# Patient Record
Sex: Male | Born: 1955 | Race: White | Hispanic: No | Smoking: Current some day smoker
Health system: Southern US, Community
[De-identification: ages and names within clinical notes are randomized; demographics above are authoritative.]

## PROBLEM LIST (undated history)

## (undated) DIAGNOSIS — J449 Chronic obstructive pulmonary disease, unspecified: Secondary | ICD-10-CM

## (undated) DIAGNOSIS — I10 Essential (primary) hypertension: Secondary | ICD-10-CM

## (undated) DIAGNOSIS — I219 Acute myocardial infarction, unspecified: Secondary | ICD-10-CM

## (undated) DIAGNOSIS — E119 Type 2 diabetes mellitus without complications: Secondary | ICD-10-CM

## (undated) DIAGNOSIS — E7800 Pure hypercholesterolemia, unspecified: Secondary | ICD-10-CM

## (undated) HISTORY — DX: Acute myocardial infarction, unspecified: I21.9

## (undated) HISTORY — PX: NO PAST SURGERIES: SHX2092

---

## 2007-05-16 ENCOUNTER — Emergency Department (HOSPITAL_COMMUNITY): Admission: EM | Admit: 2007-05-16 | Discharge: 2007-05-16 | Payer: Self-pay | Admitting: Emergency Medicine

## 2014-06-05 ENCOUNTER — Emergency Department: Payer: Self-pay | Admitting: Emergency Medicine

## 2014-06-05 LAB — CBC
HCT: 42.5 % (ref 40.0–52.0)
HGB: 14.1 g/dL (ref 13.0–18.0)
MCH: 30.2 pg (ref 26.0–34.0)
MCHC: 33.1 g/dL (ref 32.0–36.0)
MCV: 91 fL (ref 80–100)
PLATELETS: 198 10*3/uL (ref 150–440)
RBC: 4.67 10*6/uL (ref 4.40–5.90)
RDW: 13.1 % (ref 11.5–14.5)
WBC: 8.1 10*3/uL (ref 3.8–10.6)

## 2014-06-05 LAB — BASIC METABOLIC PANEL
Anion Gap: 10 (ref 7–16)
BUN: 14 mg/dL (ref 7–18)
CHLORIDE: 100 mmol/L (ref 98–107)
CO2: 26 mmol/L (ref 21–32)
CREATININE: 1.06 mg/dL (ref 0.60–1.30)
Calcium, Total: 9.1 mg/dL (ref 8.5–10.1)
EGFR (African American): >60
GLUCOSE: 407 mg/dL — AB (ref 65–99)
Osmolality: 290 (ref 275–301)
Potassium: 3.6 mmol/L (ref 3.5–5.1)
SODIUM: 136 mmol/L (ref 136–145)

## 2014-06-05 LAB — TROPONIN I: Troponin-I: <0.02 ng/mL

## 2014-06-06 LAB — TROPONIN I

## 2014-06-06 LAB — PRO B NATRIURETIC PEPTIDE: B-TYPE NATIURETIC PEPTID: 101 pg/mL (ref 0–125)

## 2014-06-06 LAB — D-DIMER(ARMC): D-DIMER: 187 ng/mL

## 2014-11-19 ENCOUNTER — Emergency Department: Payer: Medicaid Other

## 2014-11-19 ENCOUNTER — Encounter: Payer: Self-pay | Admitting: Emergency Medicine

## 2014-11-19 ENCOUNTER — Emergency Department
Admission: EM | Admit: 2014-11-19 | Discharge: 2014-11-19 | Discharge status: Against Medical Advice | Payer: Medicaid Other | Attending: Emergency Medicine | Admitting: Emergency Medicine

## 2014-11-19 DIAGNOSIS — J441 Chronic obstructive pulmonary disease with (acute) exacerbation: Secondary | ICD-10-CM | POA: Diagnosis not present

## 2014-11-19 DIAGNOSIS — R079 Chest pain, unspecified: Secondary | ICD-10-CM

## 2014-11-19 DIAGNOSIS — Z720 Tobacco use: Secondary | ICD-10-CM | POA: Insufficient documentation

## 2014-11-19 DIAGNOSIS — E119 Type 2 diabetes mellitus without complications: Secondary | ICD-10-CM | POA: Insufficient documentation

## 2014-11-19 DIAGNOSIS — I10 Essential (primary) hypertension: Secondary | ICD-10-CM | POA: Insufficient documentation

## 2014-11-19 HISTORY — DX: Chronic obstructive pulmonary disease, unspecified: J44.9

## 2014-11-19 HISTORY — DX: Essential (primary) hypertension: I10

## 2014-11-19 HISTORY — DX: Type 2 diabetes mellitus without complications: E11.9

## 2014-11-19 HISTORY — DX: Pure hypercholesterolemia, unspecified: E78.00

## 2014-11-19 LAB — BASIC METABOLIC PANEL
ANION GAP: 8 (ref 5–15)
BUN: 10 mg/dL (ref 6–20)
CALCIUM: 8.5 mg/dL — AB (ref 8.9–10.3)
CHLORIDE: 108 mmol/L (ref 101–111)
CO2: 24 mmol/L (ref 22–32)
CREATININE: 0.69 mg/dL (ref 0.61–1.24)
Glucose, Bld: 250 mg/dL — ABNORMAL HIGH (ref 65–99)
POTASSIUM: 3.3 mmol/L — AB (ref 3.5–5.1)
SODIUM: 140 mmol/L (ref 135–145)

## 2014-11-19 LAB — CBC
HCT: 41.9 % (ref 40.0–52.0)
Hemoglobin: 13.9 g/dL (ref 13.0–18.0)
MCH: 29.7 pg (ref 26.0–34.0)
MCHC: 33.3 g/dL (ref 32.0–36.0)
MCV: 89.4 fL (ref 80.0–100.0)
PLATELETS: 186 10*3/uL (ref 150–440)
RBC: 4.69 MIL/uL (ref 4.40–5.90)
RDW: 12.8 % (ref 11.5–14.5)
WBC: 8.2 10*3/uL (ref 3.8–10.6)

## 2014-11-19 LAB — TROPONIN I: Troponin I: <0.03 ng/mL (ref <0.031)

## 2014-11-19 LAB — BRAIN NATRIURETIC PEPTIDE: B NATRIURETIC PEPTIDE 5: 36 pg/mL (ref 0.0–100.0)

## 2014-11-19 NOTE — ED Notes (Signed)
Pt walking in hallway talking on phone; steady gait noted with no distress; pt reports wanting to leave now; instructed to wait in room while I get an update from the doctor; pt voices understanding

## 2014-11-19 NOTE — ED Notes (Signed)
Patient admits to having ETOH on board prior to arrival, patient reports "I took one shot of liquor tonight."

## 2014-11-19 NOTE — ED Provider Notes (Signed)
[facility] Emergency Department Provider Note  ____________________________________________  Time seen: Approximately 1:32 AM  I have reviewed the triage vital signs and the nursing notes.   HISTORY  Chief Complaint Chest Pain  The patient is intoxicated  HPI Edward Richard is a 59 y.o. male who comes in with chest pain and arm numbness. The patient reports that approximately 2330 he started having some chest pain, arm numbness and shortness of breath. 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 He reports that he take his breathing medicine as he does have COPD and thought that may be the cause reports that his chest pain did not resolve. The patient reports that he was with his girlfriend so she called the ambulance even his history. The patient reports that the pain lasted for approximately 10-15 minutes. He reports that he was drinking tonight. He reports he drank 2 drinks of liquor and was watching TV when everything started. The patient reports that currently his pain is a 1-2 out of 10 in intensity. The patient was given aspirin by ambulance. He reports that the pain was sharp and like pressure. He reports it does feel like his previous MI. The pain is also substernal. The patient denies any sweating or nausea and vomiting. The patient reports that he has been dizzy.   Past Medical History  Diagnosis Date  . Stroke   . Diabetes mellitus without complication   . Hypertension   . COPD (chronic obstructive pulmonary disease)   . Hypercholesteremia     There are no active problems to display for this patient.   History reviewed. No pertinent past surgical history.   Stent x 2  No current outpatient prescriptions on file.  Allergies Review of patient's allergies indicates no known allergies.  Family History  Problem Relation Age of Onset  . Cancer Mother   . Heart failure Father   . Cancer Father     Social History History   Substance Use Topics  . Smoking status: Current Every Day Smoker -- 0.50 packs/day    Types: Cigarettes  . Smokeless tobacco: Not on file  . Alcohol Use: Yes    Review of Systems Constitutional: No fever/chills Eyes: No visual changes. ENT: No sore throat. Cardiovascular: chest pain. Respiratory:  shortness of breath. Gastrointestinal: No abdominal pain.  No nausea, no vomiting.   Genitourinary: Negative for dysuria. Musculoskeletal: Negative for back pain. Skin: Negative for rash. Neurological: Negative for headaches,  10-point ROS otherwise negative.  ____________________________________________   PHYSICAL EXAM:  VITAL SIGNS: ED Triage Vitals  Enc Vitals Group     BP 11/19/14 0105 134/81 mmHg     Pulse Rate 11/19/14 0105 80     Resp 11/19/14 0105 16     Temp 11/19/14 0105 98.1 F (36.7 C)     Temp Source 11/19/14 0105 Oral     SpO2 11/19/14 0054 98 %     Weight 11/19/14 0105 185 lb (83.915 kg)     Height 11/19/14 0105 5\' 9"  (1.753 m)     Head Cir --      Peak Flow --      Pain Score 11/19/14 0106 1     Pain Loc --      Pain Edu? --      Excl. in GC? --     Constitutional: Alert and oriented. Well appearing and in no acute distress. Eyes: Conjunctivae are normal. PERRL. EOMI. Head: Atraumatic. Nose: No congestion/rhinnorhea.  Mouth/Throat: Mucous membranes are moist.  Oropharynx non-erythematous. Cardiovascular: Normal rate, regular rhythm. Grossly normal heart sounds.  Good peripheral circulation. Respiratory: Normal respiratory effort.  No retractions. Lungs CTAB. Gastrointestinal: Soft and nontender. No distention. No abdominal bruits. No CVA tenderness. Genitourinary: Deferred Musculoskeletal: No lower extremity tenderness nor edema.   Neurologic:  Normal speech and language. No gross focal neurologic deficits are appreciated. Speech is normal.  Skin:  Skin is warm, dry and intact. No rash noted. Psychiatric: Mood and affect are normal. Speech and  behavior are normal.  ____________________________________________   LABS (all labs ordered are listed, but only abnormal results are displayed)  Labs Reviewed  BASIC METABOLIC PANEL - Abnormal; Notable for the following:    Potassium 3.3 (*)    Glucose, Bld 250 (*)    Calcium 8.5 (*)    All other components within normal limits  CBC  BRAIN NATRIURETIC PEPTIDE  TROPONIN I   ____________________________________________  EKG  ED ECG REPORT   Date: 11/19/2014  EKG Time: 058  Rate: 79  Rhythm: normal EKG, normal sinus rhythm  Axis: Normal  Intervals:none  ST&T Change: None  ____________________________________________  RADIOLOGY  Chest x-ray: no acute cardiopulmonary disease ____________________________________________   PROCEDURES  Procedure(s) performed: None  Critical Care performed: No  ____________________________________________   INITIAL IMPRESSION / ASSESSMENT AND PLAN / ED COURSE  Pertinent labs & imaging results that were available during my care of the patient were reviewed by me and considered in my medical decision making (see chart for details).  The patient is a 59 year old male with a history of diabetes high blood pressure and previous MIs who comes in tonight with some chest pain and arm numbness and shortness of breath with a concern for another MRI. The patient reports his pain at this time is gone. We will check the patient's blood work to include 2 sets of troponins, we will check the patient's chest x-ray and evaluate the patient for any return of her ongoing chest pain.  ----------------------------------------- 3:47 AM on 11/19/2014 -----------------------------------------  The patient's initial set of blood work is unremarkable. We will repeat the patient's troponin. While awaiting for the 3 hour mark the patient became acutely agitated and upset. He was yelling on the phone to his significant other. It was explained to him that he was  awaiting the repeat of his blood work to evaluate for a possible heart attack. The patient at that point started yelling that he had not seen him and Dr. and was using racial slurs. The patient decided to get up and leave the emergency department as he did not want to be here anymore. ____________________________________________   FINAL CLINICAL IMPRESSION(S) / ED DIAGNOSES  Final diagnoses:  Chest Pain      Rebecka ApleyAllison P Maizie Garno, MD 11/19/14 [phone]

## 2014-11-19 NOTE — ED Notes (Signed)
Pt brought in via ems from home with  Chest pain and sob.  States etoh tonight.  No diaphoresis.  No n/v/d.  Pt alert.  Iv in place.

## 2014-11-19 NOTE — ED Notes (Signed)
[facility] complaining of shortness of breath, dizziness, left sided chest pain and left arm numbness. Patient reports pain began last night, patient reports was watching TV when pain started. Patient reports began to have shortness of breath, took his inhaler, and then began to have left arm numbness and left sided chest pain. EMS states gave patient 324 mg of Aspirin. Patient has history of 2 previous MI's. Patient denies abdominal pain, fevers, diaphoresis. Patient alert and oriented, calm and cooperative. Patient speaking in complete sentences. Patient placed on cardiac monitor.

## 2014-11-19 NOTE — ED Notes (Signed)
Pt now sitting on stretcher talking on phone; instructed that MD would still like to recheck his cardiac enzymes due to his history (as previously discussed) and that he would be here at least for the next hr or two; pt speaking with male on the phone stating "they all stupid in this place, don't know what the hell they doing in here; I'm ready to leave!; you know this is [place] they don't know what they doing!"; male overhead telling patient importance of having his blood redrawn; pt then st "well I'm gonna lay back down and go to sleep then"; pt hangs up phone and continues to state "everyone here is stupid; I ain't even seen no doctor and I been here since 1!"; pt again informed about the importance of having his blood redrawn and that a doctor has already examined him; pt st "ain't no man been in here!"; pt informed that his physician is a woman; pt st "you talking about that nigger woman that was in here?!" informed pt that his physician is a black woman and was in here and has already explained her plan of care; pt st "them dam niggers will tell you anything, they'll kill you up in here! Just go to [place] and look!  Everyone of em lies! I ain't kissing her ass, you can go on out there and kiss her ass if you want to but she ain't gonna lie to me!  You probably like them kind of people anyway!"; pt continues to state he wants to leave despite informing him of the importance; cont to curse and st "nigger lovers, all of you!  I ain't staying up in here!"; pt's SL d/c'd from left arm at pt request--cannula intact, dsng applied and pt walks out of room; [facility] [place] informed and MD informed

## 2014-11-19 NOTE — ED Notes (Signed)
nsr on monitor.  Iv in place.  Skin warm and dry.  States no chest pain.

## 2015-09-22 DIAGNOSIS — E1142 Type 2 diabetes mellitus with diabetic polyneuropathy: Secondary | ICD-10-CM | POA: Insufficient documentation

## 2015-09-22 DIAGNOSIS — B182 Chronic viral hepatitis C: Secondary | ICD-10-CM | POA: Insufficient documentation

## 2015-09-22 DIAGNOSIS — Z720 Tobacco use: Secondary | ICD-10-CM | POA: Insufficient documentation

## 2016-10-14 ENCOUNTER — Telehealth: Payer: Self-pay | Admitting: *Deleted

## 2016-11-15 DIAGNOSIS — G894 Chronic pain syndrome: Secondary | ICD-10-CM | POA: Insufficient documentation

## 2016-11-15 NOTE — Progress Notes (Signed)
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 comfortable position is nearly impossible. Pain wakes you up from deep sleep. Physical signs will be obvious: pupillary dilation; increased sweating; goosebumps; brisk reflexes; cold, clammy hands and feet; nausea, vomiting or dry heaves; loss of appetite; significant sleep disturbance with inability to fall asleep or to remain asleep. When persistent, significant weight loss is observed due to the complete loss of appetite and sleep deprivation.  Blood pressure and heart rate becomes significantly elevated. Caution: If elevated blood pressure triggers a pounding headache associated with blurred vision, then the patient should immediately seek attention at an urgent or emergency care unit, as these may be signs of an impending stroke.    Emergency Department Pain Levels (6-10/10)  Emergency Room Pain 6 Severely limiting. Requires emergency care and should not be seen or managed at an outpatient pain management facility. Communication becomes difficult and  requires great effort. Assistance to reach the emergency department may be required. Facial flushing and profuse sweating along with potentially dangerous increases in heart rate and blood pressure will be evident.   Distressing pain 7 Self-care is very difficult. Assistance is required to transport, or use restroom. Assistance to reach the emergency department will be required. Tasks requiring coordination, such as bathing and getting dressed become very difficult.   Disabling pain 8 Self-care is no longer possible. At this level, pain is disabling. The individual is unable to do even the most "basic" activities such as walking, eating, bathing, dressing, transferring to a bed, or toileting. Fine motor skills are lost. It is difficult to think clearly.   Incapacitating pain 9 Pain becomes incapacitating. Thought processing is  no longer possible. Difficult to remember your own name. Control of movement and coordination are lost.   The worst pain imaginable 10 At this level, most patients pass out from pain. When this level is reached, collapse of the autonomic nervous system occurs, leading to a sudden drop in blood pressure and heart rate. This in turn results in a temporary and dramatic drop in blood flow to the brain, leading to a loss of consciousness. Fainting is one of the body's self defense mechanisms. Passing out puts the brain in a calmed state and causes it to shut down for a while, in order to begin the healing process.    Summary: 1. Refer to this scale when providing [place] with your pain level. 2. Be accurate and careful when reporting your pain level. This will help with your care. 3. Over-reporting your pain level will lead to loss of credibility. 4. Even a level of 1/10 means that there is pain and will be treated at our facility. 5. High, inaccurate reporting will be documented as "Symptom Exaggeration", leading to loss of credibility and suspicions of possible secondary gains such as  obtaining more narcotics, or wanting to appear disabled, for fraudulent reasons. 6. Only pain levels of 5 or below will be seen at our facility. 7. Pain levels of 6 and above will be sent to the Emergency Department and the appointment cancelled. _____________________________________________________________________________________________

## 2016-11-16 ENCOUNTER — Ambulatory Visit: Payer: Medicaid Other | Attending: Pain Medicine | Admitting: Pain Medicine

## 2016-11-16 ENCOUNTER — Encounter (INDEPENDENT_AMBULATORY_CARE_PROVIDER_SITE_OTHER): Payer: Self-pay

## 2016-11-16 ENCOUNTER — Encounter: Payer: Self-pay | Admitting: Pain Medicine

## 2016-11-16 VITALS — BP 163/83 | HR 82 | Temp 98.7°F | Ht 69.0 in | Wt 204.0 lb

## 2016-11-16 DIAGNOSIS — M79673 Pain in unspecified foot: Secondary | ICD-10-CM | POA: Diagnosis not present

## 2016-11-16 DIAGNOSIS — M79643 Pain in unspecified hand: Secondary | ICD-10-CM

## 2016-11-16 DIAGNOSIS — M79671 Pain in right foot: Secondary | ICD-10-CM | POA: Diagnosis not present

## 2016-11-16 DIAGNOSIS — M5030 Other cervical disc degeneration, unspecified cervical region: Secondary | ICD-10-CM

## 2016-11-16 DIAGNOSIS — G8929 Other chronic pain: Secondary | ICD-10-CM | POA: Insufficient documentation

## 2016-11-16 DIAGNOSIS — Z79899 Other long term (current) drug therapy: Secondary | ICD-10-CM | POA: Insufficient documentation

## 2016-11-16 DIAGNOSIS — Z79891 Long term (current) use of opiate analgesic: Secondary | ICD-10-CM | POA: Diagnosis not present

## 2016-11-16 DIAGNOSIS — Z7901 Long term (current) use of anticoagulants: Secondary | ICD-10-CM | POA: Diagnosis not present

## 2016-11-16 DIAGNOSIS — G894 Chronic pain syndrome: Secondary | ICD-10-CM | POA: Insufficient documentation

## 2016-11-16 DIAGNOSIS — M79672 Pain in left foot: Secondary | ICD-10-CM | POA: Diagnosis not present

## 2016-11-16 DIAGNOSIS — I252 Old myocardial infarction: Secondary | ICD-10-CM | POA: Insufficient documentation

## 2016-11-16 DIAGNOSIS — Z794 Long term (current) use of insulin: Secondary | ICD-10-CM | POA: Diagnosis not present

## 2016-11-16 DIAGNOSIS — M545 Low back pain: Secondary | ICD-10-CM

## 2016-11-16 DIAGNOSIS — M79674 Pain in right toe(s): Secondary | ICD-10-CM | POA: Diagnosis not present

## 2016-11-16 DIAGNOSIS — Z7902 Long term (current) use of antithrombotics/antiplatelets: Secondary | ICD-10-CM | POA: Insufficient documentation

## 2016-11-16 DIAGNOSIS — E7800 Pure hypercholesterolemia, unspecified: Secondary | ICD-10-CM | POA: Insufficient documentation

## 2016-11-16 DIAGNOSIS — E1142 Type 2 diabetes mellitus with diabetic polyneuropathy: Secondary | ICD-10-CM | POA: Insufficient documentation

## 2016-11-16 DIAGNOSIS — F17210 Nicotine dependence, cigarettes, uncomplicated: Secondary | ICD-10-CM | POA: Insufficient documentation

## 2016-11-16 DIAGNOSIS — I10 Essential (primary) hypertension: Secondary | ICD-10-CM | POA: Insufficient documentation

## 2016-11-16 DIAGNOSIS — M792 Neuralgia and neuritis, unspecified: Secondary | ICD-10-CM | POA: Insufficient documentation

## 2016-11-16 DIAGNOSIS — M50321 Other cervical disc degeneration at C4-C5 level: Secondary | ICD-10-CM | POA: Insufficient documentation

## 2016-11-16 DIAGNOSIS — J449 Chronic obstructive pulmonary disease, unspecified: Secondary | ICD-10-CM | POA: Diagnosis not present

## 2016-11-16 DIAGNOSIS — M79641 Pain in right hand: Secondary | ICD-10-CM | POA: Insufficient documentation

## 2016-11-16 DIAGNOSIS — F1190 Opioid use, unspecified, uncomplicated: Secondary | ICD-10-CM | POA: Insufficient documentation

## 2016-11-16 DIAGNOSIS — M79675 Pain in left toe(s): Secondary | ICD-10-CM

## 2016-11-16 NOTE — Patient Instructions (Signed)

## 2016-11-16 NOTE — Progress Notes (Signed)
Safety precautions to be maintained throughout the outpatient stay will include: orient to surroundings, keep bed in low position, maintain call bell within reach at all times, provide assistance with transfer out of bed and ambulation.  

## 2016-11-20 LAB — COMPLIANCE DRUG ANALYSIS, UR

## 2018-09-18 DIAGNOSIS — E782 Mixed hyperlipidemia: Secondary | ICD-10-CM | POA: Insufficient documentation

## 2018-09-18 DIAGNOSIS — E119 Type 2 diabetes mellitus without complications: Secondary | ICD-10-CM | POA: Insufficient documentation

## 2018-09-18 DIAGNOSIS — I10 Essential (primary) hypertension: Secondary | ICD-10-CM | POA: Insufficient documentation

## 2019-01-04 ENCOUNTER — Ambulatory Visit: Payer: Medicaid Other | Admitting: Podiatry

## 2019-12-10 ENCOUNTER — Inpatient Hospital Stay
Admission: EM | Admit: 2019-12-10 | Discharge: 2019-12-15 | DRG: 239 | Disposition: A | Discharge status: Home / Self Care | Payer: Medicaid Other | Attending: Internal Medicine | Admitting: Internal Medicine

## 2019-12-10 ENCOUNTER — Encounter: Payer: Self-pay | Admitting: Emergency Medicine

## 2019-12-10 ENCOUNTER — Other Ambulatory Visit: Payer: Self-pay

## 2019-12-10 ENCOUNTER — Emergency Department: Payer: Medicaid Other

## 2019-12-10 ENCOUNTER — Inpatient Hospital Stay: Payer: Medicaid Other

## 2019-12-10 DIAGNOSIS — I2510 Atherosclerotic heart disease of native coronary artery without angina pectoris: Secondary | ICD-10-CM | POA: Diagnosis present

## 2019-12-10 DIAGNOSIS — Z794 Long term (current) use of insulin: Secondary | ICD-10-CM | POA: Diagnosis not present

## 2019-12-10 DIAGNOSIS — Z09 Encounter for follow-up examination after completed treatment for conditions other than malignant neoplasm: Secondary | ICD-10-CM

## 2019-12-10 DIAGNOSIS — B182 Chronic viral hepatitis C: Secondary | ICD-10-CM | POA: Diagnosis present

## 2019-12-10 DIAGNOSIS — E785 Hyperlipidemia, unspecified: Secondary | ICD-10-CM | POA: Diagnosis present

## 2019-12-10 DIAGNOSIS — L03116 Cellulitis of left lower limb: Secondary | ICD-10-CM | POA: Diagnosis present

## 2019-12-10 DIAGNOSIS — L97528 Non-pressure chronic ulcer of other part of left foot with other specified severity: Secondary | ICD-10-CM | POA: Diagnosis not present

## 2019-12-10 DIAGNOSIS — Z20822 Contact with and (suspected) exposure to covid-19: Secondary | ICD-10-CM | POA: Diagnosis present

## 2019-12-10 DIAGNOSIS — F1421 Cocaine dependence, in remission: Secondary | ICD-10-CM | POA: Diagnosis present

## 2019-12-10 DIAGNOSIS — W19XXXA Unspecified fall, initial encounter: Secondary | ICD-10-CM | POA: Diagnosis present

## 2019-12-10 DIAGNOSIS — Z8249 Family history of ischemic heart disease and other diseases of the circulatory system: Secondary | ICD-10-CM

## 2019-12-10 DIAGNOSIS — L97529 Non-pressure chronic ulcer of other part of left foot with unspecified severity: Secondary | ICD-10-CM | POA: Diagnosis present

## 2019-12-10 DIAGNOSIS — D62 Acute posthemorrhagic anemia: Secondary | ICD-10-CM | POA: Diagnosis not present

## 2019-12-10 DIAGNOSIS — G894 Chronic pain syndrome: Secondary | ICD-10-CM | POA: Diagnosis present

## 2019-12-10 DIAGNOSIS — A419 Sepsis, unspecified organism: Secondary | ICD-10-CM | POA: Diagnosis not present

## 2019-12-10 DIAGNOSIS — J449 Chronic obstructive pulmonary disease, unspecified: Secondary | ICD-10-CM | POA: Diagnosis present

## 2019-12-10 DIAGNOSIS — E1151 Type 2 diabetes mellitus with diabetic peripheral angiopathy without gangrene: Secondary | ICD-10-CM | POA: Diagnosis present

## 2019-12-10 DIAGNOSIS — I10 Essential (primary) hypertension: Secondary | ICD-10-CM | POA: Diagnosis present

## 2019-12-10 DIAGNOSIS — G63 Polyneuropathy in diseases classified elsewhere: Secondary | ICD-10-CM | POA: Diagnosis not present

## 2019-12-10 DIAGNOSIS — M86072 Acute hematogenous osteomyelitis, left ankle and foot: Secondary | ICD-10-CM | POA: Diagnosis not present

## 2019-12-10 DIAGNOSIS — L97919 Non-pressure chronic ulcer of unspecified part of right lower leg with unspecified severity: Secondary | ICD-10-CM | POA: Diagnosis present

## 2019-12-10 DIAGNOSIS — Z7902 Long term (current) use of antithrombotics/antiplatelets: Secondary | ICD-10-CM

## 2019-12-10 DIAGNOSIS — R7881 Bacteremia: Secondary | ICD-10-CM | POA: Diagnosis not present

## 2019-12-10 DIAGNOSIS — M86172 Other acute osteomyelitis, left ankle and foot: Secondary | ICD-10-CM | POA: Diagnosis present

## 2019-12-10 DIAGNOSIS — A480 Gas gangrene: Secondary | ICD-10-CM | POA: Diagnosis present

## 2019-12-10 DIAGNOSIS — M792 Neuralgia and neuritis, unspecified: Secondary | ICD-10-CM | POA: Diagnosis present

## 2019-12-10 DIAGNOSIS — Z955 Presence of coronary angioplasty implant and graft: Secondary | ICD-10-CM

## 2019-12-10 DIAGNOSIS — E7800 Pure hypercholesterolemia, unspecified: Secondary | ICD-10-CM | POA: Diagnosis present

## 2019-12-10 DIAGNOSIS — D72828 Other elevated white blood cell count: Secondary | ICD-10-CM

## 2019-12-10 DIAGNOSIS — E1169 Type 2 diabetes mellitus with other specified complication: Secondary | ICD-10-CM | POA: Diagnosis present

## 2019-12-10 DIAGNOSIS — I252 Old myocardial infarction: Secondary | ICD-10-CM | POA: Diagnosis not present

## 2019-12-10 DIAGNOSIS — M869 Osteomyelitis, unspecified: Secondary | ICD-10-CM | POA: Diagnosis not present

## 2019-12-10 DIAGNOSIS — E1165 Type 2 diabetes mellitus with hyperglycemia: Secondary | ICD-10-CM | POA: Diagnosis present

## 2019-12-10 DIAGNOSIS — D649 Anemia, unspecified: Secondary | ICD-10-CM | POA: Diagnosis not present

## 2019-12-10 DIAGNOSIS — B9562 Methicillin resistant Staphylococcus aureus infection as the cause of diseases classified elsewhere: Secondary | ICD-10-CM | POA: Diagnosis present

## 2019-12-10 DIAGNOSIS — E1142 Type 2 diabetes mellitus with diabetic polyneuropathy: Secondary | ICD-10-CM | POA: Diagnosis present

## 2019-12-10 DIAGNOSIS — Z809 Family history of malignant neoplasm, unspecified: Secondary | ICD-10-CM

## 2019-12-10 DIAGNOSIS — E11621 Type 2 diabetes mellitus with foot ulcer: Secondary | ICD-10-CM | POA: Diagnosis present

## 2019-12-10 DIAGNOSIS — L97509 Non-pressure chronic ulcer of other part of unspecified foot with unspecified severity: Secondary | ICD-10-CM

## 2019-12-10 DIAGNOSIS — M86672 Other chronic osteomyelitis, left ankle and foot: Secondary | ICD-10-CM | POA: Diagnosis not present

## 2019-12-10 DIAGNOSIS — Z79899 Other long term (current) drug therapy: Secondary | ICD-10-CM

## 2019-12-10 DIAGNOSIS — F17210 Nicotine dependence, cigarettes, uncomplicated: Secondary | ICD-10-CM | POA: Diagnosis present

## 2019-12-10 DIAGNOSIS — E1152 Type 2 diabetes mellitus with diabetic peripheral angiopathy with gangrene: Secondary | ICD-10-CM | POA: Diagnosis present

## 2019-12-10 DIAGNOSIS — E0865 Diabetes mellitus due to underlying condition with hyperglycemia: Secondary | ICD-10-CM | POA: Diagnosis not present

## 2019-12-10 DIAGNOSIS — D509 Iron deficiency anemia, unspecified: Secondary | ICD-10-CM | POA: Diagnosis present

## 2019-12-10 LAB — COMPREHENSIVE METABOLIC PANEL
ALT: 9 U/L (ref 0–44)
AST: 15 U/L (ref 15–41)
Albumin: 3.5 g/dL (ref 3.5–5.0)
Alkaline Phosphatase: 70 U/L (ref 38–126)
Anion gap: 11 (ref 5–15)
BUN: 17 mg/dL (ref 8–23)
CO2: 22 mmol/L (ref 22–32)
Calcium: 8.5 mg/dL — ABNORMAL LOW (ref 8.9–10.3)
Chloride: 101 mmol/L (ref 98–111)
Creatinine, Ser: 1.08 mg/dL (ref 0.61–1.24)
GFR calc Af Amer: >60 mL/min (ref >60)
GFR calc non Af Amer: >60 mL/min (ref >60)
Glucose, Bld: 217 mg/dL — ABNORMAL HIGH (ref 70–99)
Potassium: 3.9 mmol/L (ref 3.5–5.1)
Sodium: 134 mmol/L — ABNORMAL LOW (ref 135–145)
Total Bilirubin: 0.9 mg/dL (ref 0.3–1.2)
Total Protein: 8.6 g/dL — ABNORMAL HIGH (ref 6.5–8.1)

## 2019-12-10 LAB — CBC WITH DIFFERENTIAL/PLATELET
Abs Immature Granulocytes: 0.04 10*3/uL (ref 0.00–0.07)
Basophils Absolute: 0.1 10*3/uL (ref 0.0–0.1)
Basophils Relative: 0 %
Eosinophils Absolute: 0 10*3/uL (ref 0.0–0.5)
Eosinophils Relative: 0 %
HCT: 34.4 % — ABNORMAL LOW (ref 39.0–52.0)
Hemoglobin: 11.5 g/dL — ABNORMAL LOW (ref 13.0–17.0)
Immature Granulocytes: 0 %
Lymphocytes Relative: 11 %
Lymphs Abs: 1.5 10*3/uL (ref 0.7–4.0)
MCH: 29.5 pg (ref 26.0–34.0)
MCHC: 33.4 g/dL (ref 30.0–36.0)
MCV: 88.2 fL (ref 80.0–100.0)
Monocytes Absolute: 1 10*3/uL (ref 0.1–1.0)
Monocytes Relative: 7 %
Neutro Abs: 10.9 10*3/uL — ABNORMAL HIGH (ref 1.7–7.7)
Neutrophils Relative %: 82 %
Platelets: 357 10*3/uL (ref 150–400)
RBC: 3.9 MIL/uL — ABNORMAL LOW (ref 4.22–5.81)
RDW: 12.1 % (ref 11.5–15.5)
WBC: 13.5 10*3/uL — ABNORMAL HIGH (ref 4.0–10.5)
nRBC: 0 % (ref 0.0–0.2)

## 2019-12-10 LAB — GLUCOSE, CAPILLARY
Glucose-Capillary: 205 mg/dL — ABNORMAL HIGH (ref 70–99)
Glucose-Capillary: 228 mg/dL — ABNORMAL HIGH (ref 70–99)

## 2019-12-10 LAB — URINALYSIS, COMPLETE (UACMP) WITH MICROSCOPIC
Bacteria, UA: NONE SEEN
Bilirubin Urine: NEGATIVE
Glucose, UA: 150 mg/dL — AB
Ketones, ur: NEGATIVE mg/dL
Leukocytes,Ua: NEGATIVE
Nitrite: NEGATIVE
Protein, ur: 100 mg/dL — AB
Specific Gravity, Urine: 1.024 (ref 1.005–1.030)
Squamous Epithelial / HPF: NONE SEEN (ref 0–5)
pH: 5 (ref 5.0–8.0)

## 2019-12-10 LAB — SARS CORONAVIRUS 2 BY RT PCR (HOSPITAL ORDER, PERFORMED IN ~~LOC~~ HOSPITAL LAB): SARS Coronavirus 2: NEGATIVE

## 2019-12-10 LAB — LACTIC ACID, PLASMA
Lactic Acid, Venous: 0.9 mmol/L (ref 0.5–1.9)
Lactic Acid, Venous: 1 mmol/L (ref 0.5–1.9)

## 2019-12-10 LAB — C-REACTIVE PROTEIN: CRP: 10.8 mg/dL — ABNORMAL HIGH (ref <1.0)

## 2019-12-10 LAB — SEDIMENTATION RATE: Sed Rate: 95 mm/hr — ABNORMAL HIGH (ref 0–20)

## 2019-12-10 LAB — PROTIME-INR
INR: 1.1 (ref 0.8–1.2)
Prothrombin Time: 13.9 seconds (ref 11.4–15.2)

## 2019-12-10 MED ORDER — GADOBUTROL 1 MMOL/ML IV SOLN
7.0000 mL | Freq: Once | INTRAVENOUS | Status: AC | PRN
  Administered 2019-12-10: 7 mL via INTRAVENOUS

## 2019-12-10 MED ORDER — BISACODYL 5 MG PO TBEC
5.0000 mg | DELAYED_RELEASE_TABLET | Freq: Every day | ORAL | Status: DC | PRN
  Administered 2019-12-13: 5 mg via ORAL
  Filled 2019-12-10: qty 1

## 2019-12-10 MED ORDER — CHLORHEXIDINE GLUCONATE 4 % EX LIQD
60.0000 mL | Freq: Once | CUTANEOUS | Status: AC
  Administered 2019-12-11: 4 via TOPICAL

## 2019-12-10 MED ORDER — MORPHINE SULFATE (PF) 4 MG/ML IV SOLN
4.0000 mg | Freq: Once | INTRAVENOUS | Status: AC
  Administered 2019-12-10: 4 mg via INTRAVENOUS
  Filled 2019-12-10: qty 1

## 2019-12-10 MED ORDER — SODIUM CHLORIDE 0.9 % IV SOLN: 1.0000 g | INTRAVENOUS | Status: DC

## 2019-12-10 MED ORDER — ONDANSETRON HCL 4 MG/2ML IJ SOLN
4.0000 mg | Freq: Once | INTRAMUSCULAR | Status: AC
  Administered 2019-12-10: 4 mg via INTRAVENOUS
  Filled 2019-12-10: qty 2

## 2019-12-10 MED ORDER — DULOXETINE HCL 30 MG PO CPEP
30.0000 mg | ORAL_CAPSULE | Freq: Every day | ORAL | Status: DC
  Administered 2019-12-10 – 2019-12-15 (×5): 30 mg via ORAL
  Filled 2019-12-10 (×6): qty 1

## 2019-12-10 MED ORDER — ENSURE PRE-SURGERY PO LIQD
296.0000 mL | Freq: Once | ORAL | Status: AC
  Administered 2019-12-11: 296 mL via ORAL
  Filled 2019-12-10: qty 296

## 2019-12-10 MED ORDER — HYDRALAZINE HCL 50 MG PO TABS: 50.0000 mg | ORAL_TABLET | Freq: Four times a day (QID) | ORAL | Status: DC | PRN

## 2019-12-10 MED ORDER — NICOTINE 14 MG/24HR TD PT24
14.0000 mg | MEDICATED_PATCH | Freq: Every day | TRANSDERMAL | Status: DC
  Administered 2019-12-10 – 2019-12-15 (×5): 14 mg via TRANSDERMAL
  Filled 2019-12-10 (×5): qty 1

## 2019-12-10 MED ORDER — SIMVASTATIN 10 MG PO TABS
40.0000 mg | ORAL_TABLET | Freq: Every day | ORAL | Status: DC
  Administered 2019-12-10: 40 mg via ORAL
  Filled 2019-12-10: qty 4

## 2019-12-10 MED ORDER — ATORVASTATIN CALCIUM 20 MG PO TABS
20.0000 mg | ORAL_TABLET | Freq: Every evening | ORAL | Status: DC
  Administered 2019-12-11 – 2019-12-14 (×4): 20 mg via ORAL
  Filled 2019-12-10 (×4): qty 1

## 2019-12-10 MED ORDER — SODIUM CHLORIDE 0.9 % IV SOLN
3.0000 g | Freq: Four times a day (QID) | INTRAVENOUS | Status: DC
  Administered 2019-12-10 – 2019-12-15 (×18): 3 g via INTRAVENOUS
  Filled 2019-12-10: qty 8
  Filled 2019-12-10: qty 3
  Filled 2019-12-10 (×4): qty 8
  Filled 2019-12-10: qty 3
  Filled 2019-12-10 (×2): qty 8
  Filled 2019-12-10 (×2): qty 3
  Filled 2019-12-10 (×5): qty 8
  Filled 2019-12-10: qty 3
  Filled 2019-12-10 (×8): qty 8

## 2019-12-10 MED ORDER — AMLODIPINE BESYLATE 10 MG PO TABS
10.0000 mg | ORAL_TABLET | Freq: Every day | ORAL | Status: DC
  Administered 2019-12-10 – 2019-12-15 (×5): 10 mg via ORAL
  Filled 2019-12-10: qty 2
  Filled 2019-12-10 (×4): qty 1

## 2019-12-10 MED ORDER — ACETAMINOPHEN 325 MG PO TABS
650.0000 mg | ORAL_TABLET | Freq: Four times a day (QID) | ORAL | Status: DC | PRN
  Administered 2019-12-12: 650 mg via ORAL
  Filled 2019-12-10: qty 2

## 2019-12-10 MED ORDER — METOPROLOL TARTRATE 25 MG PO TABS
25.0000 mg | ORAL_TABLET | Freq: Two times a day (BID) | ORAL | Status: DC
  Administered 2019-12-11 – 2019-12-15 (×10): 25 mg via ORAL
  Filled 2019-12-10 (×10): qty 1

## 2019-12-10 MED ORDER — ENOXAPARIN SODIUM 40 MG/0.4ML ~~LOC~~ SOLN
40.0000 mg | SUBCUTANEOUS | Status: DC
  Administered 2019-12-11 – 2019-12-14 (×4): 40 mg via SUBCUTANEOUS
  Filled 2019-12-10 (×4): qty 0.4

## 2019-12-10 MED ORDER — OXYCODONE HCL 5 MG PO TABS
10.0000 mg | ORAL_TABLET | Freq: Four times a day (QID) | ORAL | Status: DC | PRN
  Administered 2019-12-10 – 2019-12-15 (×11): 10 mg via ORAL
  Filled 2019-12-10 (×10): qty 2

## 2019-12-10 MED ORDER — GABAPENTIN 400 MG PO CAPS
800.0000 mg | ORAL_CAPSULE | Freq: Three times a day (TID) | ORAL | Status: DC
  Administered 2019-12-10 – 2019-12-15 (×13): 800 mg via ORAL
  Filled 2019-12-10 (×15): qty 2

## 2019-12-10 MED ORDER — ONDANSETRON HCL 4 MG PO TABS: 4.0000 mg | ORAL_TABLET | Freq: Four times a day (QID) | ORAL | Status: DC | PRN

## 2019-12-10 MED ORDER — SODIUM CHLORIDE 0.9% FLUSH
3.0000 mL | Freq: Two times a day (BID) | INTRAVENOUS | Status: DC
  Administered 2019-12-11 – 2019-12-15 (×7): 3 mL via INTRAVENOUS

## 2019-12-10 MED ORDER — ACETAMINOPHEN 650 MG RE SUPP: 650.0000 mg | Freq: Four times a day (QID) | RECTAL | Status: DC | PRN

## 2019-12-10 MED ORDER — SODIUM CHLORIDE 0.9 % IV SOLN
2.0000 g | Freq: Once | INTRAVENOUS | Status: DC
  Administered 2019-12-10: 2 g via INTRAVENOUS
  Filled 2019-12-10: qty 20

## 2019-12-10 MED ORDER — SODIUM CHLORIDE 0.9 % IV BOLUS
1000.0000 mL | Freq: Once | INTRAVENOUS | Status: AC
  Administered 2019-12-10: 1000 mL via INTRAVENOUS

## 2019-12-10 MED ORDER — VANCOMYCIN HCL 1750 MG/350ML IV SOLN
1750.0000 mg | INTRAVENOUS | Status: DC
  Filled 2019-12-10: qty 350

## 2019-12-10 MED ORDER — VANCOMYCIN HCL IN DEXTROSE 1-5 GM/200ML-% IV SOLN
1000.0000 mg | Freq: Once | INTRAVENOUS | Status: AC
  Administered 2019-12-10: 1000 mg via INTRAVENOUS
  Filled 2019-12-10: qty 200

## 2019-12-10 MED ORDER — HYDROMORPHONE HCL 1 MG/ML IJ SOLN
0.5000 mg | INTRAMUSCULAR | Status: DC | PRN
  Administered 2019-12-11: 0.5 mg via INTRAVENOUS
  Filled 2019-12-10: qty 1

## 2019-12-10 MED ORDER — ONDANSETRON HCL 4 MG/2ML IJ SOLN: 4.0000 mg | Freq: Four times a day (QID) | INTRAMUSCULAR | Status: DC | PRN

## 2019-12-10 MED ORDER — INSULIN ASPART 100 UNIT/ML ~~LOC~~ SOLN
0.0000 [IU] | Freq: Three times a day (TID) | SUBCUTANEOUS | Status: DC
  Administered 2019-12-11: 3 [IU] via SUBCUTANEOUS
  Administered 2019-12-11 – 2019-12-12 (×3): 5 [IU] via SUBCUTANEOUS
  Administered 2019-12-13 (×2): 2 [IU] via SUBCUTANEOUS
  Administered 2019-12-14 (×2): 3 [IU] via SUBCUTANEOUS
  Administered 2019-12-15 (×2): 5 [IU] via SUBCUTANEOUS
  Filled 2019-12-10 (×10): qty 1

## 2019-12-10 NOTE — ED Provider Notes (Signed)
[facility] Emergency Department Provider Note  Time seen: 4:37 PM  I have reviewed the triage vital signs and the nursing notes.   HISTORY  Chief Complaint Wound Infection   HPI Edward Richard is a 64 y.o. male with a past medical history of COPD, diabetes, hypertension, hyperlipidemia, presents to the emergency department for left foot/leg infection.  According to the patient 4 days ago the patient was running after a dog and scraped his left foot on brick steps.  He states since that happened it has continued to get more painful more red and more swollen is now become very foul-smelling.  Upon arrival to the emergency department patient found to be febrile to 100.7 on but known to the patient.  Denies any vomiting no chest pain shortness of breath or abdominal pain.  Largely negative review of systems otherwise.   Past Medical History:  Diagnosis Date  . COPD (chronic obstructive pulmonary disease) ([place])   . Diabetes mellitus without complication ([place])   . Hypercholesteremia   . Hypertension   . Myocardial infarction [facility])     Patient Active Problem List   Diagnosis Date Noted  . Chronic hand pain (Location of Primary Source of Pain) (Bilateral) (R>L) 11/16/2016  . Chronic foot pain (Location of Secondary source of pain) (Bilateral) (R>L) 11/16/2016  . Chronic feet pain (Location of Secondary source of pain) (Bilateral) (R>L) 11/16/2016  . Long term prescription opiate use 11/16/2016  . Opiate use 11/16/2016  . Neuropathic pain 11/16/2016  . Chronic low back pain (Location of Tertiary source of pain) (Bilateral) (R>L) 11/16/2016  . Long term current use of anticoagulant therapy (Plavix) 11/16/2016  . Cervical DDD (from C4-5 to C6-7) 11/16/2016  . Chronic pain syndrome 11/15/2016  . Chronic hepatitis C without hepatic coma ([place]) 09/22/2015  . Diabetic peripheral neuropathy ([place]) 09/22/2015  . Tobacco abuse 09/22/2015    Past Surgical History:   Procedure Laterality Date  . NO PAST SURGERIES      Prior to Admission medications   Medication Sig Start Date End Date Taking? Authorizing Provider  clopidogrel (PLAVIX) 75 MG tablet Take 75 mg by mouth daily.    [provider]  gabapentin (NEURONTIN) 400 MG capsule Take 400 mg by mouth 4 (four) times daily.    [provider]  insulin glargine (LANTUS) 100 UNIT/ML injection Inject 28 Units into the skin 2 (two) times daily.    [provider]  simvastatin (ZOCOR) 40 MG tablet Take 40 mg by mouth daily.    [provider]    No Known Allergies  Family History  Problem Relation Age of Onset  . Cancer Mother   . Heart failure Father   . Cancer Father     Social History Social History   Tobacco Use  . Smoking status: Current Every Day Smoker    Packs/day: 0.50    Types: Cigarettes  Substance Use Topics  . Alcohol use: Yes  . Drug use: No    Review of Systems Constitutional: Patient febrile in the emergency department, unknown to the patient previously. ENT: Negative for recent illness/congestion Cardiovascular: Negative for chest pain. Respiratory: Negative for shortness of breath. Gastrointestinal: Negative for abdominal pain, vomiting  Musculoskeletal: Left foot infection Skin: Left foot infection Neurological: Negative for headache All other ROS negative  ____________________________________________   PHYSICAL EXAM:  VITAL SIGNS: ED Triage Vitals  Enc Vitals Group     BP 12/10/19 1208 (!) 170/76     Pulse  Rate 12/10/19 1208 (!) 8     Resp 12/10/19 1208 18     Temp 12/10/19 1208 (!) 100.7 F (38.2 C)     Temp Source 12/10/19 1208 Oral     SpO2 12/10/19 1208 99 %     Weight 12/10/19 1209 175 lb (79.4 kg)     Height 12/10/19 1209 5\' 9"  (1.753 m)     Head Circumference --      Peak Flow --      Pain Score 12/10/19 1209 10     Pain Loc --      Pain Edu? --      Excl. in GC? --    Constitutional: Alert and oriented.  Well appearing and in no distress. Eyes: Normal exam ENT      Head: Normocephalic and atraumatic.      Mouth/Throat: Mucous membranes are moist. Cardiovascular: Normal rate, regular rhythm. Respiratory: Normal respiratory effort without tachypnea nor retractions. Breath sounds are clear Gastrointestinal: Soft and nontender. No distention.  Musculoskeletal: Patient with 2+ lower extremity edema to left lower extremity with erythema of the left foot extending up the left leg.  Appears to be neurovascular intact distally. Neurologic:  Normal speech and language. No gross focal neurologic deficits  Skin: Moderate erythema of the left foot with a ulceration to the lateral aspect of the distal left foot with purulent discharge. Psychiatric: Mood and affect are normal  ____________________________________________    EKG  EKG viewed and interpreted by myself shows a normal sinus rhythm at 79 bpm with a narrow QRS, normal axis, normal intervals, no concerning ST changes.  ____________________________________________    RADIOLOGY  Foot x-ray shows swelling with some subcutaneous emphysema.  ____________________________________________   INITIAL IMPRESSION / ASSESSMENT AND PLAN / ED COURSE  Pertinent labs & imaging results that were available during my care of the patient were reviewed by me and considered in my medical decision making (see chart for details).   Patient presents to the emergency department for left foot infection.  Patient is diabetic.  Found to be febrile with an elevated white blood cell count meeting sepsis criteria.  Started on broad-spectrum antibiotics.  We will obtain an ultrasound to evaluate the left lower extremity as well.  I have consulted Dr. 02/09/20 of podiatry who will be in later to see the patient.  I have admitted the patient to the hospital service for further treatment and work-up.  Edward Richard was evaluated in Emergency Department on 12/10/2019 for the  symptoms described in the history of present illness. He was evaluated in the context of the global COVID-19 pandemic, which necessitated consideration that the patient might be at risk for infection with the SARS-CoV-2 virus that causes COVID-19. Institutional protocols and algorithms that pertain to the evaluation of patients at risk for COVID-19 are in a state of rapid change based on information released by regulatory bodies including the CDC and federal and state organizations. These policies and algorithms were followed during the patient's care in the ED.  CRITICAL CARE Performed by: 02/09/2020   Total critical care time: 30 minutes  Critical care time was exclusive of separately billable procedures and treating other patients.  Critical care was necessary to treat or prevent imminent or life-threatening deterioration.  Critical care was time spent personally by me on the following activities: development of treatment plan with patient and/or surrogate as well as nursing, discussions with consultants, evaluation of patient's response to treatment, examination of patient, obtaining history  from patient or surrogate, ordering and performing treatments and interventions, ordering and review of laboratory studies, ordering and review of radiographic studies, pulse oximetry and re-evaluation of patient's condition.  ____________________________________________   FINAL CLINICAL IMPRESSION(S) / ED DIAGNOSES  Cellulitis Sepsis   Minna Antis, MD 12/10/19 1640

## 2019-12-10 NOTE — Consult Note (Addendum)
Pharmacy Antibiotic Note  Edward Richard is a 64 y.o. 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 Presented to ED with nonhealing foot wound.  Pharmacy has been consulted for Vancomycin dosing. Patient also receiving Unasyn currently.  Plan: Vancomycin 1750 mg IV Q 24 hrs. Goal AUC 400-550. Expected AUC: 489.7 Expected Css: 10.0 SCr used: 1.08   Height: 5\' 9"  (175.3 cm) Weight: 79.4 kg (175 lb) IBW/kg (Calculated) : 70.7  Temp (24hrs), Avg:100.7 F (38.2 C), Min:100.7 F (38.2 C), Max:100.7 F (38.2 C)  Recent Labs  Lab 12/10/19 1213 12/10/19 1411  WBC 13.5*  --   CREATININE 1.08  --   LATICACIDVEN 0.9 1.0    Estimated Creatinine Clearance: 70 mL/min (by C-G formula based on SCr of 1.08 mg/dL).    No Known Allergies  Antimicrobials this admission: Vancomycin 6/7 >>  Unasyn 6/7 >>  Microbiology results: 6/7 BCx: pending 6/7 Wound cx: pending    Thank you for allowing pharmacy to be a part of this patient's care.  Madisin Hasan A Cheney Ewart 12/10/2019 8:02 PM

## 2019-12-10 NOTE — ED Triage Notes (Signed)
First Nurse Note:  Arrives from [facility] for evaluation of left foot wound that is not healing.  Patient has history of diabetes.  AAOx3.  Skin warm and dry. NAD

## 2019-12-10 NOTE — ED Notes (Signed)
Pt sleeping.  Pt waiting on room for admission.

## 2019-12-10 NOTE — Consult Note (Addendum)
PODIATRY / FOOT AND ANKLE SURGERY CONSULTATION NOTE  Requesting Physician: Eppie Gibson, MD  Reason for consult: L foot wound  Chief Complaint: Left foot wound   HPI: Edward Richard is a 64 y.o. male who presents with a nonhealing left foot wound.  Patient states that she had an incident on Thursday in which he scraped his foot on a brick and initially did not think it was so bad so did not seek medical attention.  Patient began having pain that was sharp and constant with radiation and nothing seems to really have made it better.  Patient tried taking Tylenol but did not have any relief.  Patient has noted a foul odor coming from the wound and went to the [facility] to get it checked out.  Patient was seen in the ED and x-rays were taken showing osteomyelitic changes to the fifth metatarsal phalangeal joint as well as gas in the subcutaneous tissues.  Patient also has vascular calcifications.  Patient does not recall what his last hemoglobin A1c is and says that his blood sugars are up and down.  He also notes that he is a smoker.  Patient was febrile on admission and noted to have signs of sepsis.  PMHx:  Past Medical History:  Diagnosis Date  . COPD (chronic obstructive pulmonary disease) ([place])   . Diabetes mellitus without complication ([place])   . Hypercholesteremia   . Hypertension   . Myocardial infarction [facility])     Surgical Hx:  Past Surgical History:  Procedure Laterality Date  . NO PAST SURGERIES      FHx:  Family History  Problem Relation Age of Onset  . Cancer Mother   . Heart failure Father   . Cancer Father     Social History:  reports that he has been smoking cigarettes. He has been smoking about 0.50 packs per day. He does not have any smokeless tobacco history on file. He reports current alcohol use. He reports that he does not use drugs.  Allergies: No Known Allergies  Review of Systems: General ROS: positive for  - chills, fatigue and  fever Respiratory ROS: no cough, shortness of breath, or wheezing Cardiovascular ROS: no chest pain or dyspnea on exertion Gastrointestinal ROS: no abdominal pain, change in bowel habits, or black or bloody stools Musculoskeletal ROS: positive for - joint pain, joint stiffness and joint swelling Neurological ROS: positive for - numbness/tingling Dermatological ROS: positive for Ulceration left fifth metatarsal phalangeal joint plantar  (Not in a hospital admission)   Physical Exam: General: Alert and oriented.  No apparent distress.  Vascular: DP/PT pulses faintly palpable bilateral.  CFT appears to be intact to digits with only mild delayed to the fifth toe left.  Moderate edema and erythema to the left fifth toe and lateral column area of the foot that extends to the midfoot  Neuro: Light touch sensation absent to bilateral lower extremities  Derm: Ulceration to the plantar lateral aspect of the left fifth metatarsal phalangeal joint that appears to probe to bone that has a fibronecrotic base with gas coming from tissue, bone appears to be exposed at the metatarsal phalangeal joint.  Seropurulent drainage able be expressed with associated erythema and edema that extends from the fifth toe to the midfoot.  Wound measures approximately 3 cm x 2.7 cm x 1.2 cm to bone prior to and after debridement.    MSK: Left foot pain on palpation.  Results for orders placed or performed during the  hospital encounter of 12/10/19 (from the past 48 hour(s))  Comprehensive metabolic panel     Status: Abnormal   Collection Time: 12/10/19 12:13 PM  Result Value Ref Range   Sodium 134 (L) 135 - 145 mmol/L   Potassium 3.9 3.5 - 5.1 mmol/L   Chloride 101 98 - 111 mmol/L   CO2 22 22 - 32 mmol/L   Glucose, Bld 217 (H) 70 - 99 mg/dL    Comment: Glucose reference range applies only to samples taken after fasting for at least 8 hours.   BUN 17 8 - 23 mg/dL   Creatinine, Ser 1.60 0.61 - 1.24 mg/dL   Calcium  8.5 (L) 8.9 - 10.3 mg/dL   Total Protein 8.6 (H) 6.5 - 8.1 g/dL   Albumin 3.5 3.5 - 5.0 g/dL   AST 15 15 - 41 U/L   ALT 9 0 - 44 U/L   Alkaline Phosphatase 70 38 - 126 U/L   Total Bilirubin 0.9 0.3 - 1.2 mg/dL   GFR calc non Af Amer >60 >60 mL/min   GFR calc Af Amer >60 >60 mL/min   Anion gap 11 5 - 15    Comment: Performed at [facility], [address] [place], [place] [address]  Lactic acid, plasma     Status: None   Collection Time: 12/10/19 12:13 PM  Result Value Ref Range   Lactic Acid, Venous 0.9 0.5 - 1.9 mmol/L    Comment: Performed at [facility], [address] [place], [place] [address]  CBC with Differential     Status: Abnormal   Collection Time: 12/10/19 12:13 PM  Result Value Ref Range   WBC 13.5 (H) 4.0 - 10.5 K/uL   RBC 3.90 (L) 4.22 - 5.81 MIL/uL   Hemoglobin 11.5 (L) 13.0 - 17.0 g/dL   HCT 85.4 (L) 62.7 - 03.5 %   MCV 88.2 80.0 - 100.0 fL   MCH 29.5 26.0 - 34.0 pg   MCHC 33.4 30.0 - 36.0 g/dL   RDW 00.9 38.1 - 82.9 %   Platelets 357 150 - 400 K/uL   nRBC 0.0 0.0 - 0.2 %   Neutrophils Relative % 82 %   Neutro Abs 10.9 (H) 1.7 - 7.7 K/uL   Lymphocytes Relative 11 %   Lymphs Abs 1.5 0.7 - 4.0 K/uL   Monocytes Relative 7 %   Monocytes Absolute 1.0 0.1 - 1.0 K/uL   Eosinophils Relative 0 %   Eosinophils Absolute 0.0 0.0 - 0.5 K/uL   Basophils Relative 0 %   Basophils Absolute 0.1 0.0 - 0.1 K/uL   Immature Granulocytes 0 %   Abs Immature Granulocytes 0.04 0.00 - 0.07 K/uL    Comment: Performed at [facility], [address] [place], [place] [address]  Protime-INR     Status: None   Collection Time: 12/10/19 12:13 PM  Result Value Ref Range   Prothrombin Time 13.9 11.4 - 15.2 seconds   INR 1.1 0.8 - 1.2    Comment: (NOTE) INR goal varies based on device and disease states. Performed at [facility], [address] [place], [place] [address]   Glucose, capillary     Status: Abnormal   Collection Time:  12/10/19 12:18 PM  Result Value Ref Range   Glucose-Capillary 205 (H) 70 - 99 mg/dL    Comment: Glucose reference range applies only to samples taken after fasting for at least 8 hours.  Lactic acid, plasma     Status:  None   Collection Time: 12/10/19  2:11 PM  Result Value Ref Range   Lactic Acid, Venous 1.0 0.5 - 1.9 mmol/L    Comment: Performed at [facility], [address] [place], [place] [address]  Sedimentation rate     Status: Abnormal   Collection Time: 12/10/19  3:42 PM  Result Value Ref Range   Sed Rate 95 (H) 0 - 20 mm/hr    Comment: Performed at [facility], [address] [place], [place] [address]   US Venous Img Lower Unilateral Left  Result Date: 12/10/2019 CLINICAL DATA:  Left lower extremity pain and swelling EXAM: LEFT LOWER EXTREMITY VENOUS DOPPLER ULTRASOUND TECHNIQUE: Gray-scale sonography with graded compression, as well as color Doppler and duplex ultrasound were performed to evaluate the lower extremity deep venous systems from the level of the common femoral vein and including the common femoral, femoral, profunda femoral, popliteal and calf veins including the posterior tibial, peroneal and gastrocnemius veins when visible. The superficial great saphenous vein was also interrogated. Spectral Doppler was utilized to evaluate flow at rest and with distal augmentation maneuvers in the common femoral, femoral and popliteal veins. COMPARISON:  None. FINDINGS: Contralateral Common Femoral Vein: Respiratory phasicity is normal and symmetric with the symptomatic side. No evidence of thrombus. Normal compressibility. Common Femoral Vein: No evidence of thrombus. Normal compressibility, respiratory phasicity and response to augmentation. Saphenofemoral Junction: No evidence of thrombus. Normal compressibility and flow on color Doppler imaging. Profunda Femoral Vein: No evidence of thrombus. Normal compressibility and flow on color Doppler imaging. Femoral  Vein: No evidence of thrombus. Normal compressibility, respiratory phasicity and response to augmentation. Popliteal Vein: No evidence of thrombus. Normal compressibility, respiratory phasicity and response to augmentation. Calf Veins: No evidence of thrombus. Normal compressibility and flow on color Doppler imaging. Superficial Great Saphenous Vein: No evidence of thrombus. Normal compressibility. Venous Reflux:  None. Other Findings:  None. IMPRESSION: No evidence of left lower extremity deep venous thrombosis. Electronically Signed   By: Duanne Guess D.O.   On: 12/10/2019 17:18   DG Foot Complete Left  Result Date: 12/10/2019 CLINICAL DATA:  Laceration, infection EXAM: LEFT FOOT - COMPLETE 3+ VIEW COMPARISON:  None. FINDINGS: No fracture or dislocation of the left foot. There is a soft tissue wound overlying the distal fifth metatarsal and metatarsophalangeal joint overlying bandage material and some evidence of subcutaneous emphysema. Mild first metatarsophalangeal arthrosis. Soft tissue edema. Vascular calcinosis. IMPRESSION: 1. No fracture or dislocation of the left foot. No radiopaque foreign body. 2. There is a soft tissue wound overlying the distal fifth metatarsal and metatarsophalangeal joint. There is some evidence of subcutaneous emphysema, concerning for gas-forming infection. Electronically Signed   By: Lauralyn Primes M.D.   On: 12/10/2019 12:43    Blood pressure (!) 158/86, pulse 80, temperature (!) 100.7 F (38.2 C), temperature source Oral, resp. rate 16, height 5\' 9"  (1.753 m), weight 79.4 kg, SpO2 99 %.  Assessment 1. 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 Diabetes type 2 with polyneuropathy 3. PVD 4. History of tobacco abuse 5. Chronic pain  Plan -Patient seen and examined. -X-ray reviewed and discussed with patient in detail.  Believe that there are subtle changes within the fifth metatarsal phalangeal  joint at the lateral aspect consistent with osteomyelitic changes based on where the wound is.  Subcutaneous gas also noted within the tissues.  That do not extend past the distal shaft portion of the fifth  metatarsal. -Discussed with patient findings and need for further treatment. -Performed bedside 100% excisional debridement to the left plantar fifth metatarsal phalangeal joint wound removing all nonviable necrotic tissue to the level of bone, including bone with a 15 blade and scissors.  Probing was performed around the tissues of the gas that was seen on x-ray and the area was opened up slightly with a 15 blade.  Deep culture taken and sent off.  Appeared to have minimal bleeding during this.  Flushed with Betadine.  Predebridement measurement same as post debridement. -Apply dressing consisting of Betadine soaked gauze followed by 4 x 4 gauze, Kerlix, tape. -Discussed case with Dr. Gilda Crease.  Vascular consultation ordered.  Discussed with vascular team that surgical intervention for the foot infection is needed prior to revascularization procedure.  They are planning on doing CTA potentially with intervention on Wednesday after further assessment tomorrow. -Discussed all treatment options with the patient both conservative and surgical attempts at correction including potential risks and complications of surgical intervention.  At this time patient has elected for surgical procedure consisting of left partial fifth ray amputation with removal of all nonviable necrotic tissue with possible incision and drainage.  Discussed with patient that this surgical plan may change potentially if MRI findings are worse. -MRI ordered. -Appreciate medicine recommendations for antibiotic therapy, continue IV. -Patient to be partial weightbearing with heel contact and surgical shoe.  Ordered surgical shoe.  Order dressing changes daily until procedure. -Patient has been placed n.p.o. after 730 tomorrow on 12/11/2019 for  surgical procedure.  Likely will have procedure somewhere around 4 PM.   Rosetta Posner, DPM 12/10/2019, 7:13 PM

## 2019-12-10 NOTE — Progress Notes (Signed)
CODE SEPSIS - PHARMACY COMMUNICATION  **Broad Spectrum Antibiotics should be administered within 1 hour of Sepsis diagnosis**  Time Code Sepsis Called/Page Received: 1620  Antibiotics Ordered: Rocephin, Vancomycin  Time of 1st antibiotic administration: 1716  Additional action taken by pharmacy: none  If necessary, Name of Provider/Nurse Contacted: n/a    Bettey Costa ,PharmD Clinical Pharmacist  12/10/2019  5:44 PM

## 2019-12-10 NOTE — ED Notes (Signed)
Pt taken [place] and X-Ray at this time.

## 2019-12-10 NOTE — ED Triage Notes (Signed)
Patient reports cutting left foot on a brick outside on Thursday. Patient states he was tried to keep wound clean since then, however redness, swelling and draining have worsened. Purulent drainage noted from wound on side of left foot. Patient reports history of diabetes. Fever in triage 100.7.

## 2019-12-10 NOTE — Progress Notes (Signed)
Did not receive report from RN Osa Craver, she called but the room was not ready. Notified her I will call her back once the room is ready. Report was not received during conversation.

## 2019-12-10 NOTE — Progress Notes (Signed)
PHARMACY -  BRIEF ANTIBIOTIC NOTE   Pharmacy has received consult(s) for Vancomycin from an ED provider.  The patient's profile has been reviewed for ht/wt/allergies/indication/available labs.    One time order(s) placed for Vancomycin 1g IV x 1 dose.  Further antibiotics/pharmacy consults should be ordered by admitting physician if indicated.                       Thank you, Bettey Costa 12/10/2019  4:19 PM

## 2019-12-10 NOTE — Progress Notes (Signed)
Notified bedside nurse of need to administer antibiotics.  

## 2019-12-10 NOTE — H&P (Signed)
History and Physical    Edward Richard KVQ:[id number] DOB: 1956/02/19 DOA: 12/10/2019  PCP: Theotis Burrow, MD  Patient coming from: home    Chief Complaint: foot pain   HPI: 64 y/o M w/ PMH of DM2, peripheral neuropathy, HLD, HTN who presents w/ left foot wound x 5 days. The pt scrapped his foot on a brick and initially thought it was not that bad so the pt did not seek medical attention. Pt c/o left foot pain that is sharp, constant w/ radiation up the leg. Nothing makes the pain better or worse. Pt tried taking tylenol w/o any relief. Pt says still has some feeling in his feet but does say he looks at his feet daily for any sores, ulcers, etc. Pt denies any fevers, chills, sweating, chest pain, shortness of breath, nausea, vomiting, abd pain, dysuria, urinary urgency, urinary frequency, diarrhea or constipation. Of note, pt was suppose to be taking metformin at home but he took himself off, as it "didn't do me any good." Pt does evidently take insulin at home. Pt is unsure of his last HbA1c.   Review of Systems: As per HPI otherwise 10 point review of systems negative.   Past Medical History:  Diagnosis Date  . COPD (chronic obstructive pulmonary disease) ([place])   . Diabetes mellitus without complication ([place])   . Hypercholesteremia   . Hypertension   . Myocardial infarction [facility])     Past Surgical History:  Procedure Laterality Date  . NO PAST SURGERIES       reports that he has been smoking cigarettes. He has been smoking about 0.50 packs per day. He does not have any smokeless tobacco history on file. He reports current alcohol use. He reports that he does not use drugs.  No Known Allergies  Family History  Problem Relation Age of Onset  . Cancer Mother   . Heart failure Father   . Cancer Father      Prior to Admission medications   Medication Sig Start Date End Date Taking? Authorizing Provider  clopidogrel (PLAVIX) 75 MG tablet Take 75 mg by mouth daily.     [provider]  gabapentin (NEURONTIN) 400 MG capsule Take 400 mg by mouth 4 (four) times daily.    [provider]  insulin glargine (LANTUS) 100 UNIT/ML injection Inject 28 Units into the skin 2 (two) times daily.    [provider]  simvastatin (ZOCOR) 40 MG tablet Take 40 mg by mouth daily.    [provider]    Physical Exam: Vitals:   12/10/19 1208 12/10/19 1209  BP: (!) 170/76   Pulse: (!) 8   Resp: 18   Temp: (!) 100.7 F (38.2 C)   TempSrc: Oral   SpO2: 99%   Weight:  79.4 kg  Height:  '5\' 9"'$  (1.753 m)    Constitutional: NAD, calm, comfortable Vitals:   12/10/19 1208 12/10/19 1209  BP: (!) 170/76   Pulse: (!) 8   Resp: 18   Temp: (!) 100.7 F (38.2 C)   TempSrc: Oral   SpO2: 99%   Weight:  79.4 kg  Height:  '5\' 9"'$  (1.753 m)   Eyes: PERRL, lids and conjunctivae normal ENMT: Mucous membranes are moist. Neck: normal, supple Respiratory: clear to auscultation bilaterally, no wheezing, no crackles. Normal respiratory effort. Cardiovascular: S1/S2+, no rubs / gallops.  Abdomen:  Soft, no tenderness, non-distended. Bowel sounds positive.  Musculoskeletal: no cyanosis.  Good ROM, no contractures. Normal muscle tone.  Skin: left foot ulcer has purulent discharge, foul smelling, erythematosus, edematous & tender to palpation  Neurologic: CN 2-12 grossly intact. Moves all 4 extremities  Psychiatric: Normal judgment and insight. Alert and oriented x 3. Normal mood.     Labs on Admission: I have personally reviewed following labs and imaging studies  CBC: Recent Labs  Lab 12/10/19 1213  WBC 13.5*  NEUTROABS 10.9*  HGB 11.5*  HCT 34.4*  MCV 88.2  PLT 619   Basic Metabolic Panel: Recent Labs  Lab 12/10/19 1213  NA 134*  K 3.9  CL 101  CO2 22  GLUCOSE 217*  BUN 17  CREATININE 1.08  CALCIUM 8.5*   GFR: Estimated Creatinine Clearance: 70 mL/min (by C-G formula based on SCr of 1.08 mg/dL). Liver Function Tests: Recent  Labs  Lab 12/10/19 1213  AST 15  ALT 9  ALKPHOS 70  BILITOT 0.9  PROT 8.6*  ALBUMIN 3.5   No results for input(s): LIPASE, AMYLASE in the last 168 hours. No results for input(s): AMMONIA in the last 168 hours. Coagulation Profile: Recent Labs  Lab 12/10/19 1213  INR 1.1   Cardiac Enzymes: No results for input(s): CKTOTAL, CKMB, CKMBINDEX, TROPONINI in the last 168 hours. BNP (last 3 results) No results for input(s): PROBNP in the last 8760 hours. HbA1C: No results for input(s): HGBA1C in the last 72 hours. CBG: Recent Labs  Lab 12/10/19 1218  GLUCAP 205*   Lipid Profile: No results for input(s): CHOL, HDL, LDLCALC, TRIG, CHOLHDL, LDLDIRECT in the last 72 hours. Thyroid Function Tests: No results for input(s): TSH, T4TOTAL, FREET4, T3FREE, THYROIDAB in the last 72 hours. Anemia Panel: No results for input(s): VITAMINB12, FOLATE, FERRITIN, TIBC, IRON, RETICCTPCT in the last 72 hours. Urine analysis: No results found for: COLORURINE, APPEARANCEUR, LABSPEC, [place], GLUCOSEU, HGBUR, BILIRUBINUR, KETONESUR, PROTEINUR, UROBILINOGEN, NITRITE, LEUKOCYTESUR  Radiological Exams on Admission: DG Foot Complete Left  Result Date: 12/10/2019 CLINICAL DATA:  Laceration, infection EXAM: LEFT FOOT - COMPLETE 3+ VIEW COMPARISON:  None. FINDINGS: No fracture or dislocation of the left foot. There is a soft tissue wound overlying the distal fifth metatarsal and metatarsophalangeal joint overlying bandage material and some evidence of subcutaneous emphysema. Mild first metatarsophalangeal arthrosis. Soft tissue edema. Vascular calcinosis. IMPRESSION: 1. No fracture or dislocation of the left foot. No radiopaque foreign body. 2. There is a soft tissue wound overlying the distal fifth metatarsal and metatarsophalangeal joint. There is some evidence of subcutaneous emphysema, concerning for gas-forming infection. Electronically Signed   By: Eddie Candle M.D.   On: 12/10/2019 12:43    EKG:  Independently reviewed.  Assessment/Plan Active Problems:   * No active hospital problems. *  Left foot wound & cellulitis: continue on IV vanco & start unasyn. Podiatry consulted. [place] of LLE to r/o DVT. Wound care consulted. Blood cxs ordered. ESR is elevated. XR of left foot shows concern for gas forming infection. MRI left foot ordered for evaluation of possible osteomyelitis   Leukocytosis: secondary to above. Continue on IV abxs   DM2: likely poorly controlled. HbA1c ordered. Will start SSI w/ accuchecks. Carb modified diet  Peripheral neuropathy: will continue on home dose of gabapentin, duloxetine  HLD: will continue on statin  HTN: will continue on home dose of metoprolol, amlodipine. Hydralazine prn  Smoker: nicotine patch to prevent w/drawal   DVT prophylaxis: lovenox Code Status: full Family Communication:  Disposition Plan: likely d/c back home Consults called: podiatry, Dr. Luana Shu Admission status: inpatient   Wyvonnia Dusky  MD [facility] Pager [phone]   If 7PM-7AM, please contact night-coverage [email or web address]  12/10/2019, 4:34 PM

## 2019-12-10 NOTE — ED Notes (Signed)
Report given to nurse in 1A. Informed that pt will be going to 160 and room was currently dirty. Unable to inform me how long it would take. States they will call me when the room is ready.

## 2019-12-10 NOTE — Progress Notes (Signed)
PHARMACIST - PHYSICIAN ORDER COMMUNICATION  CONCERNING: Amlodipine and Simvastatin  and risk of rhabdomyolysis  DESCRIPTION:  Patients on amlodipine and simvastatin >20mg /day have reported cases of rhabdomyolysis. Pharmacy is to assess simvastatin dose. If > 20 mg, substitute atorvastatin (Lipitor) 1mg  for each 2mg  simvastatin.   This patient is ordered simvastatin 40mg  and amlodipine 10mg .    ACTION TAKEN: Per protocol pharmacy has discontinued the patient's order for simvastatin and replaced it with Atorvastatin 20mg .   , PharmD, BCPS Clinical Pharmacist 12/10/2019 6:54 PM

## 2019-12-11 ENCOUNTER — Inpatient Hospital Stay: Payer: Medicaid Other | Admitting: Anesthesiology

## 2019-12-11 ENCOUNTER — Inpatient Hospital Stay: Payer: Medicaid Other

## 2019-12-11 ENCOUNTER — Other Ambulatory Visit: Payer: Self-pay

## 2019-12-11 ENCOUNTER — Encounter: Payer: Self-pay | Admitting: Internal Medicine

## 2019-12-11 ENCOUNTER — Encounter
Admission: EM | Disposition: A | Discharge status: Home / Self Care | Payer: Self-pay | Source: Home / Self Care | Attending: Internal Medicine

## 2019-12-11 ENCOUNTER — Other Ambulatory Visit (INDEPENDENT_AMBULATORY_CARE_PROVIDER_SITE_OTHER): Payer: Self-pay | Admitting: Vascular Surgery

## 2019-12-11 DIAGNOSIS — M86072 Acute hematogenous osteomyelitis, left ankle and foot: Secondary | ICD-10-CM

## 2019-12-11 DIAGNOSIS — Z09 Encounter for follow-up examination after completed treatment for conditions other than malignant neoplasm: Secondary | ICD-10-CM

## 2019-12-11 DIAGNOSIS — M86672 Other chronic osteomyelitis, left ankle and foot: Secondary | ICD-10-CM

## 2019-12-11 DIAGNOSIS — A419 Sepsis, unspecified organism: Secondary | ICD-10-CM

## 2019-12-11 DIAGNOSIS — G63 Polyneuropathy in diseases classified elsewhere: Secondary | ICD-10-CM

## 2019-12-11 DIAGNOSIS — L03116 Cellulitis of left lower limb: Secondary | ICD-10-CM

## 2019-12-11 HISTORY — PX: INCISION AND DRAINAGE: SHX5863

## 2019-12-11 HISTORY — PX: LOWER EXTREMITY ANGIOGRAPHY: CATH118251

## 2019-12-11 HISTORY — PX: AMPUTATION: SHX166

## 2019-12-11 LAB — GLUCOSE, CAPILLARY
Glucose-Capillary: 242 mg/dL — ABNORMAL HIGH (ref 70–99)
Glucose-Capillary: 194 mg/dL — ABNORMAL HIGH (ref 70–99)
Glucose-Capillary: 56 mg/dL — ABNORMAL LOW (ref 70–99)
Glucose-Capillary: 88 mg/dL (ref 70–99)
Glucose-Capillary: 83 mg/dL (ref 70–99)
Glucose-Capillary: 89 mg/dL (ref 70–99)
Glucose-Capillary: 209 mg/dL — ABNORMAL HIGH (ref 70–99)

## 2019-12-11 LAB — BASIC METABOLIC PANEL
Anion gap: 6 (ref 5–15)
BUN: 20 mg/dL (ref 8–23)
CO2: 25 mmol/L (ref 22–32)
Calcium: 7.8 mg/dL — ABNORMAL LOW (ref 8.9–10.3)
Chloride: 103 mmol/L (ref 98–111)
Creatinine, Ser: 1.07 mg/dL (ref 0.61–1.24)
GFR calc Af Amer: >60 mL/min (ref >60)
GFR calc non Af Amer: >60 mL/min (ref >60)
Glucose, Bld: 250 mg/dL — ABNORMAL HIGH (ref 70–99)
Potassium: 4.4 mmol/L (ref 3.5–5.1)
Sodium: 134 mmol/L — ABNORMAL LOW (ref 135–145)

## 2019-12-11 LAB — HIV ANTIBODY (ROUTINE TESTING W REFLEX): HIV Screen 4th Generation wRfx: NONREACTIVE

## 2019-12-11 LAB — CBC
HCT: 30.6 % — ABNORMAL LOW (ref 39.0–52.0)
Hemoglobin: 10 g/dL — ABNORMAL LOW (ref 13.0–17.0)
MCH: 29.3 pg (ref 26.0–34.0)
MCHC: 32.7 g/dL (ref 30.0–36.0)
MCV: 89.7 fL (ref 80.0–100.0)
Platelets: 294 10*3/uL (ref 150–400)
RBC: 3.41 MIL/uL — ABNORMAL LOW (ref 4.22–5.81)
RDW: 12.4 % (ref 11.5–15.5)
WBC: 8.8 10*3/uL (ref 4.0–10.5)
nRBC: 0 % (ref 0.0–0.2)

## 2019-12-11 LAB — SURGICAL PCR SCREEN
MRSA, PCR: NEGATIVE
Staphylococcus aureus: NEGATIVE

## 2019-12-11 LAB — HEMOGLOBIN A1C
Hgb A1c MFr Bld: 9.2 % — ABNORMAL HIGH (ref 4.8–5.6)
Mean Plasma Glucose: 217.34 mg/dL

## 2019-12-11 SURGERY — LOWER EXTREMITY ANGIOGRAPHY
Anesthesia: Moderate Sedation | Laterality: Left

## 2019-12-11 SURGERY — INCISION AND DRAINAGE
Anesthesia: General | Site: Toe | Laterality: Left

## 2019-12-11 MED ORDER — MORPHINE SULFATE (PF) 2 MG/ML IV SOLN: 2.0000 mg | INTRAVENOUS | Status: DC | PRN

## 2019-12-11 MED ORDER — FENTANYL CITRATE (PF) 100 MCG/2ML IJ SOLN
INTRAMUSCULAR | Status: DC | PRN
  Administered 2019-12-11: 50 ug via INTRAVENOUS

## 2019-12-11 MED ORDER — BUPIVACAINE HCL (PF) 0.5 % IJ SOLN
INTRAMUSCULAR | Status: AC
  Filled 2019-12-11: qty 60

## 2019-12-11 MED ORDER — MIDAZOLAM HCL 2 MG/2ML IJ SOLN
INTRAMUSCULAR | Status: DC | PRN
  Administered 2019-12-11: 2 mg via INTRAVENOUS

## 2019-12-11 MED ORDER — MIDAZOLAM HCL 2 MG/2ML IJ SOLN
INTRAMUSCULAR | Status: AC
  Filled 2019-12-11: qty 2

## 2019-12-11 MED ORDER — SODIUM CHLORIDE 0.9 % IV SOLN: INTRAVENOUS | Status: AC

## 2019-12-11 MED ORDER — NEOMYCIN-POLYMYXIN B GU 40-200000 IR SOLN
Status: AC
  Filled 2019-12-11: qty 20

## 2019-12-11 MED ORDER — CEFAZOLIN SODIUM-DEXTROSE 2-4 GM/100ML-% IV SOLN
2.0000 g | Freq: Once | INTRAVENOUS | Status: DC
  Filled 2019-12-11: qty 100

## 2019-12-11 MED ORDER — HYDROMORPHONE HCL 1 MG/ML IJ SOLN: 1.0000 mg | Freq: Once | INTRAMUSCULAR | Status: DC | PRN

## 2019-12-11 MED ORDER — VANCOMYCIN HCL 1000 MG IV SOLR
INTRAVENOUS | Status: DC | PRN
  Administered 2019-12-11: 1000 mg

## 2019-12-11 MED ORDER — ONDANSETRON HCL 4 MG/2ML IJ SOLN
INTRAMUSCULAR | Status: DC | PRN
  Administered 2019-12-11: 4 mg via INTRAVENOUS

## 2019-12-11 MED ORDER — SODIUM CHLORIDE 0.9 % IV SOLN
250.0000 mL | INTRAVENOUS | Status: DC | PRN
  Administered 2019-12-14: 250 mL via INTRAVENOUS

## 2019-12-11 MED ORDER — ONDANSETRON HCL 4 MG/2ML IJ SOLN: 4.0000 mg | Freq: Four times a day (QID) | INTRAMUSCULAR | Status: DC | PRN

## 2019-12-11 MED ORDER — LABETALOL HCL 5 MG/ML IV SOLN
10.0000 mg | INTRAVENOUS | Status: DC | PRN
  Filled 2019-12-11: qty 4

## 2019-12-11 MED ORDER — SODIUM CHLORIDE 0.9 % IV SOLN: INTRAVENOUS | Status: DC

## 2019-12-11 MED ORDER — FAMOTIDINE 20 MG PO TABS: 40.0000 mg | ORAL_TABLET | Freq: Once | ORAL | Status: DC | PRN

## 2019-12-11 MED ORDER — SODIUM CHLORIDE 0.9% FLUSH
3.0000 mL | Freq: Two times a day (BID) | INTRAVENOUS | Status: DC
  Administered 2019-12-12 – 2019-12-15 (×8): 3 mL via INTRAVENOUS

## 2019-12-11 MED ORDER — BUPIVACAINE HCL 0.5 % IJ SOLN
INTRAMUSCULAR | Status: DC | PRN
  Administered 2019-12-11: 20 mL

## 2019-12-11 MED ORDER — PROPOFOL 10 MG/ML IV BOLUS
INTRAVENOUS | Status: DC | PRN
  Administered 2019-12-11: 150 mg via INTRAVENOUS

## 2019-12-11 MED ORDER — EPHEDRINE SULFATE 50 MG/ML IJ SOLN
INTRAMUSCULAR | Status: DC | PRN
  Administered 2019-12-11 (×2): 5 mg via INTRAVENOUS

## 2019-12-11 MED ORDER — FENTANYL CITRATE (PF) 100 MCG/2ML IJ SOLN
INTRAMUSCULAR | Status: AC
  Administered 2019-12-11: 25 ug via INTRAVENOUS
  Filled 2019-12-11: qty 2

## 2019-12-11 MED ORDER — SODIUM CHLORIDE 0.9% FLUSH
3.0000 mL | INTRAVENOUS | Status: DC | PRN
  Administered 2019-12-15: 3 mL via INTRAVENOUS

## 2019-12-11 MED ORDER — FENTANYL CITRATE (PF) 100 MCG/2ML IJ SOLN
INTRAMUSCULAR | Status: DC | PRN
  Administered 2019-12-11: 25 ug via INTRAVENOUS
  Administered 2019-12-11: 50 ug via INTRAVENOUS

## 2019-12-11 MED ORDER — VANCOMYCIN HCL IN DEXTROSE 1-5 GM/200ML-% IV SOLN
1000.0000 mg | Freq: Two times a day (BID) | INTRAVENOUS | Status: DC
  Administered 2019-12-11 – 2019-12-13 (×4): 1000 mg via INTRAVENOUS
  Filled 2019-12-11 (×7): qty 200

## 2019-12-11 MED ORDER — HYDRALAZINE HCL 20 MG/ML IJ SOLN
5.0000 mg | INTRAMUSCULAR | Status: DC | PRN
  Filled 2019-12-11: qty 0.25

## 2019-12-11 MED ORDER — ONDANSETRON HCL 4 MG/2ML IJ SOLN
INTRAMUSCULAR | Status: AC
  Filled 2019-12-11: qty 2

## 2019-12-11 MED ORDER — NEOMYCIN-POLYMYXIN B GU 40-200000 IR SOLN
Status: DC | PRN
  Administered 2019-12-11: 2 mL

## 2019-12-11 MED ORDER — MIDAZOLAM HCL 5 MG/5ML IJ SOLN
INTRAMUSCULAR | Status: AC
  Filled 2019-12-11: qty 5

## 2019-12-11 MED ORDER — DIPHENHYDRAMINE HCL 50 MG/ML IJ SOLN: 50.0000 mg | Freq: Once | INTRAMUSCULAR | Status: DC | PRN

## 2019-12-11 MED ORDER — LIDOCAINE HCL (PF) 1 % IJ SOLN
INTRAMUSCULAR | Status: AC
  Filled 2019-12-11: qty 30

## 2019-12-11 MED ORDER — ONDANSETRON HCL 4 MG/2ML IJ SOLN: 4.0000 mg | Freq: Once | INTRAMUSCULAR | Status: DC | PRN

## 2019-12-11 MED ORDER — DEXTROSE 50 % IV SOLN
INTRAVENOUS | Status: AC
  Administered 2019-12-11: 25 mL via INTRAVENOUS
  Filled 2019-12-11: qty 50

## 2019-12-11 MED ORDER — DEXAMETHASONE SODIUM PHOSPHATE 10 MG/ML IJ SOLN
INTRAMUSCULAR | Status: DC | PRN
  Administered 2019-12-11: 5 mg via INTRAVENOUS

## 2019-12-11 MED ORDER — GLYCOPYRROLATE 0.2 MG/ML IJ SOLN
INTRAMUSCULAR | Status: AC
  Filled 2019-12-11: qty 1

## 2019-12-11 MED ORDER — PROPOFOL 10 MG/ML IV BOLUS
INTRAVENOUS | Status: AC
  Filled 2019-12-11: qty 20

## 2019-12-11 MED ORDER — OXYCODONE HCL 5 MG PO TABS
5.0000 mg | ORAL_TABLET | ORAL | Status: DC | PRN
  Filled 2019-12-11 (×2): qty 2

## 2019-12-11 MED ORDER — DEXAMETHASONE SODIUM PHOSPHATE 10 MG/ML IJ SOLN
INTRAMUSCULAR | Status: AC
  Filled 2019-12-11: qty 1

## 2019-12-11 MED ORDER — VANCOMYCIN HCL 1000 MG IV SOLR
INTRAVENOUS | Status: AC
  Filled 2019-12-11: qty 1000

## 2019-12-11 MED ORDER — MIDAZOLAM HCL 2 MG/ML PO SYRP: 8.0000 mg | ORAL_SOLUTION | Freq: Once | ORAL | Status: DC | PRN

## 2019-12-11 MED ORDER — FENTANYL CITRATE (PF) 100 MCG/2ML IJ SOLN
INTRAMUSCULAR | Status: AC
  Filled 2019-12-11: qty 2

## 2019-12-11 MED ORDER — ACETAMINOPHEN 10 MG/ML IV SOLN
INTRAVENOUS | Status: AC
  Filled 2019-12-11: qty 100

## 2019-12-11 MED ORDER — LIDOCAINE HCL (CARDIAC) PF 100 MG/5ML IV SOSY
PREFILLED_SYRINGE | INTRAVENOUS | Status: DC | PRN
  Administered 2019-12-11: 80 mg via INTRAVENOUS

## 2019-12-11 MED ORDER — ACETAMINOPHEN 10 MG/ML IV SOLN
INTRAVENOUS | Status: DC | PRN
  Administered 2019-12-11: 1000 mg via INTRAVENOUS

## 2019-12-11 MED ORDER — DEXTROSE 50 % IV SOLN: 0.5000 | Freq: Once | INTRAVENOUS | Status: AC

## 2019-12-11 MED ORDER — GLYCOPYRROLATE 0.2 MG/ML IJ SOLN
INTRAMUSCULAR | Status: DC | PRN
  Administered 2019-12-11: .2 mg via INTRAVENOUS

## 2019-12-11 MED ORDER — ACETAMINOPHEN 325 MG PO TABS: 650.0000 mg | ORAL_TABLET | ORAL | Status: DC | PRN

## 2019-12-11 MED ORDER — PHENYLEPHRINE HCL (PRESSORS) 10 MG/ML IV SOLN
INTRAVENOUS | Status: DC | PRN
  Administered 2019-12-11 (×2): 100 ug via INTRAVENOUS

## 2019-12-11 MED ORDER — HEPARIN SODIUM (PORCINE) 1000 UNIT/ML IJ SOLN
INTRAMUSCULAR | Status: AC
  Filled 2019-12-11: qty 1

## 2019-12-11 MED ORDER — FENTANYL CITRATE (PF) 100 MCG/2ML IJ SOLN
25.0000 ug | INTRAMUSCULAR | Status: DC | PRN
  Administered 2019-12-11 (×2): 25 ug via INTRAVENOUS

## 2019-12-11 MED ORDER — METHYLPREDNISOLONE SODIUM SUCC 125 MG IJ SOLR: 125.0000 mg | Freq: Once | INTRAMUSCULAR | Status: DC | PRN

## 2019-12-11 SURGICAL SUPPLY — 71 items
BAG COUNTER SPONGE EZ (MISCELLANEOUS) ×3 IMPLANT
BLADE MED AGGRESSIVE (BLADE) ×4 IMPLANT
BLADE OSC/SAGITTAL MD 5.5X18 (BLADE) IMPLANT
BLADE OSCILLATING/SAGITTAL (BLADE)
BLADE SURG 15 STRL LF DISP TIS (BLADE) IMPLANT
BLADE SURG 15 STRL SS (BLADE)
BLADE SURG MINI STRL (BLADE) IMPLANT
BLADE SW THK.38XMED LNG THN (BLADE) IMPLANT
BNDG CONFORM 2 STRL LF (GAUZE/BANDAGES/DRESSINGS) IMPLANT
BNDG CONFORM 3 STRL LF (GAUZE/BANDAGES/DRESSINGS) IMPLANT
BNDG ELASTIC 3X5.8 VLCR NS LF (GAUZE/BANDAGES/DRESSINGS) IMPLANT
BNDG ELASTIC 4X5.8 VLCR NS LF (GAUZE/BANDAGES/DRESSINGS) IMPLANT
BNDG ELASTIC 4X5.8 VLCR STR LF (GAUZE/BANDAGES/DRESSINGS) ×4 IMPLANT
BNDG ESMARK 4X12 TAN STRL LF (GAUZE/BANDAGES/DRESSINGS) ×4 IMPLANT
BNDG GAUZE 4.5X4.1 6PLY STRL (MISCELLANEOUS) ×4 IMPLANT
BNDG STRETCH 4X75 STRL LF (GAUZE/BANDAGES/DRESSINGS) ×2 IMPLANT
CANISTER SUCT 1200ML W/VALVE (MISCELLANEOUS) ×4 IMPLANT
CANISTER SUCT 3000ML PPV (MISCELLANEOUS) IMPLANT
CNTNR SPEC 2.5X3XGRAD LEK (MISCELLANEOUS) ×2
CONT SPEC 4OZ STER OR WHT (MISCELLANEOUS) ×2
CONTAINER SPEC 2.5X3XGRAD LEK (MISCELLANEOUS) ×2 IMPLANT
COUNTER SPONGE BAG EZ (MISCELLANEOUS) ×1
COVER WAND RF STERILE (DRAPES) ×4 IMPLANT
CUFF TOURN 18 STER (MISCELLANEOUS) IMPLANT
CUFF TOURN DUAL PL 12 NO SLV (MISCELLANEOUS) IMPLANT
CUFF TOURN SGL QUICK 12 (TOURNIQUET CUFF) IMPLANT
CUFF TOURN SGL QUICK 18X4 (TOURNIQUET CUFF) IMPLANT
DRAPE FLUOR MINI C-ARM 54X84 (DRAPES) IMPLANT
DURAPREP 26ML APPLICATOR (WOUND CARE) ×4 IMPLANT
ELECT REM PT RETURN 9FT ADLT (ELECTROSURGICAL) ×4
ELECTRODE REM PT RTRN 9FT ADLT (ELECTROSURGICAL) ×2 IMPLANT
GAUZE PACKING 1/4 X5 YD (GAUZE/BANDAGES/DRESSINGS) IMPLANT
GAUZE PACKING IODOFORM 1X5 (PACKING) IMPLANT
GAUZE SPONGE 4X4 12PLY STRL (GAUZE/BANDAGES/DRESSINGS) ×6 IMPLANT
GAUZE XEROFORM 1X8 LF (GAUZE/BANDAGES/DRESSINGS) ×4 IMPLANT
GLOVE BIO SURGEON STRL SZ7 (GLOVE) ×4 IMPLANT
GLOVE INDICATOR 7.0 STRL GRN (GLOVE) ×4 IMPLANT
GOWN STRL REUS W/ TWL LRG LVL3 (GOWN DISPOSABLE) ×4 IMPLANT
GOWN STRL REUS W/TWL LRG LVL3 (GOWN DISPOSABLE) ×4
HANDLE YANKAUER SUCT BULB TIP (MISCELLANEOUS) ×2 IMPLANT
HANDPIECE VERSAJET DEBRIDEMENT (MISCELLANEOUS) IMPLANT
IV NS 1000ML (IV SOLUTION)
IV NS 1000ML BAXH (IV SOLUTION) IMPLANT
KIT TURNOVER KIT A (KITS) ×4 IMPLANT
LABEL OR SOLS (LABEL) ×4 IMPLANT
NDL FILTER BLUNT 18X1 1/2 (NEEDLE) ×2 IMPLANT
NDL HYPO 25X1 1.5 SAFETY (NEEDLE) ×4 IMPLANT
NEEDLE FILTER BLUNT 18X 1/2SAF (NEEDLE) ×2
NEEDLE FILTER BLUNT 18X1 1/2 (NEEDLE) ×2 IMPLANT
NEEDLE HYPO 25X1 1.5 SAFETY (NEEDLE) ×8 IMPLANT
NS IRRIG 500ML POUR BTL (IV SOLUTION) ×4 IMPLANT
PACK EXTREMITY (MISCELLANEOUS) ×4 IMPLANT
PAD ABD DERMACEA PRESS 5X9 (GAUZE/BANDAGES/DRESSINGS) ×4 IMPLANT
PULSAVAC PLUS IRRIG FAN TIP (DISPOSABLE)
RASP SM TEAR CROSS CUT (RASP) IMPLANT
SOL .9 NS 3000ML IRR  AL (IV SOLUTION)
SOL .9 NS 3000ML IRR UROMATIC (IV SOLUTION) IMPLANT
SOL PREP PVP 2OZ (MISCELLANEOUS) ×4
SOLUTION PREP PVP 2OZ (MISCELLANEOUS) ×2 IMPLANT
STOCKINETTE STRL 6IN 960660 (GAUZE/BANDAGES/DRESSINGS) ×4 IMPLANT
SUT ETHILON 3-0 FS-10 30 BLK (SUTURE) ×8
SUT ETHILON 4-0 (SUTURE)
SUT ETHILON 4-0 FS2 18XMFL BLK (SUTURE)
SUT VIC AB 3-0 SH 27 (SUTURE) ×2
SUT VIC AB 3-0 SH 27X BRD (SUTURE) ×2 IMPLANT
SUT VIC AB 4-0 FS2 27 (SUTURE) IMPLANT
SUTURE EHLN 3-0 FS-10 30 BLK (SUTURE) ×4 IMPLANT
SUTURE ETHLN 4-0 FS2 18XMF BLK (SUTURE) IMPLANT
SWAB CULTURE AMIES ANAERIB BLU (MISCELLANEOUS) IMPLANT
SYR 10ML LL (SYRINGE) ×4 IMPLANT
TIP FAN IRRIG PULSAVAC PLUS (DISPOSABLE) IMPLANT

## 2019-12-11 SURGICAL SUPPLY — 11 items
CATH PIG 70CM (CATHETERS) ×2 IMPLANT
DEVICE STARCLOSE SE CLOSURE (Vascular Products) ×2 IMPLANT
GLIDEWIRE ADV .035X260CM (WIRE) ×2 IMPLANT
NDL ENTRY 21GA 7CM ECHOTIP (NEEDLE) IMPLANT
NEEDLE ENTRY 21GA 7CM ECHOTIP (NEEDLE) ×3 IMPLANT
PACK ANGIOGRAPHY (CUSTOM PROCEDURE TRAY) ×2 IMPLANT
SET INTRO CAPELLA COAXIAL (SET/KITS/TRAYS/PACK) ×2 IMPLANT
SHEATH BRITE TIP 5FRX11 (SHEATH) ×2 IMPLANT
SYR MEDRAD MARK 7 150ML (SYRINGE) ×2 IMPLANT
TUBING CONTRAST HIGH PRESS 72 (TUBING) ×2 IMPLANT
WIRE J 3MM .035X145CM (WIRE) ×2 IMPLANT

## 2019-12-11 NOTE — Progress Notes (Signed)
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 Continue on IV vanco & unasyn. Will go for left partial 5th ray amputation 12/11/19 as per podiatry.  Podiatry following and recs apprec. Vascular surg will do CTA potentially w/ intervention on 12/12/19. [place] of LLE neg for DVT. Wound care consulted. Blood cxs NGTD. ESR is elevated.   Leukocytosis: resolved  DM2: poorly controlled. HbA1c 9.2. Will continue SSI w/ accuchecks. Carb modified diet  Peripheral neuropathy: will continue on home dose of gabapentin, duloxetine  HLD: will continue on statin  HTN: will continue on home dose of metoprolol, amlodipine. Hydralazine prn  Smoker: nicotine patch to prevent w/drawal     DVT prophylaxis: lovenox Code Status: full  Family Communication:  Disposition Plan: likely home vs home health    Consultants:   Podiatry  Vascular surg   Procedures:    Antimicrobials: vanco & unasyn    Subjective: Pt c/o left foot pain   Objective: Vitals:   12/10/19 2300 12/10/19 2330 12/10/19 2354 12/11/19 0000  BP: (!) 115/50 (!) 107/47 (!) 148/67   Pulse: 72 70 86   Resp:   19   Temp:   98.5 F (36.9 C)   TempSrc:   Oral   SpO2:   99%   Weight:    83.4 kg  Height:        Intake/Output Summary (Last 24 hours) at 12/11/2019 0756 Last data filed at 12/11/2019 0153 Gross per 24 hour  Intake 1499.23 ml  Output --  Net 1499.23 ml   Filed Weights   12/10/19 1209 12/11/19 0000  Weight: 79.4 kg 83.4 kg    Examination:  General exam: Appears calm and comfortable  Respiratory system: Clear to auscultation. No rales, rhonchi  Cardiovascular system: S1  & S2 +. No  rubs, gallops or clicks.  Gastrointestinal system: Abdomen is nondistended, soft and nontender.  Normal bowel sounds heard. Central nervous system: Alert and oriented. Moves all 4 extremities  Psychiatry: Judgement and insight appear normal. Flat mood and affect. Skin: LLE is dressed and dressing is intact but partially saturated     Data Reviewed: I have personally reviewed following labs and imaging studies  CBC: Recent Labs  Lab 12/10/19 1213 12/11/19 0530  WBC 13.5* 8.8  NEUTROABS 10.9*  --   HGB 11.5* 10.0*  HCT 34.4* 30.6*  MCV 88.2 89.7  PLT 357 585   Basic Metabolic Panel: Recent Labs  Lab 12/10/19 1213 12/11/19 0530  NA 134* 134*  K 3.9 4.4  CL 101 103  CO2 22 25  GLUCOSE 217* 250*  BUN 17 20  CREATININE 1.08 1.07  CALCIUM 8.5* 7.8*   GFR: Estimated Creatinine Clearance: 70.7 mL/min (by C-G formula based on SCr of 1.07 mg/dL). Liver Function Tests: Recent Labs  Lab 12/10/19 1213  AST 15  ALT 9  ALKPHOS 70  BILITOT 0.9  PROT 8.6*  ALBUMIN 3.5   No results for input(s): LIPASE, AMYLASE in the last 168 hours. No results for input(s): AMMONIA in the last 168 hours. Coagulation Profile: Recent Labs  Lab 12/10/19 1213  INR 1.1   Cardiac Enzymes: No results for input(s):  CKTOTAL, CKMB, CKMBINDEX, TROPONINI in the last 168 hours. BNP (last 3 results) No results for input(s): PROBNP in the last 8760 hours. HbA1C: No results for input(s): HGBA1C in the last 72 hours. CBG: Recent Labs  Lab 12/10/19 1218 12/10/19 2140  GLUCAP 205* 228*   Lipid Profile: No results for input(s): CHOL, HDL, LDLCALC, TRIG, CHOLHDL, LDLDIRECT in the last 72 hours. Thyroid Function Tests: No results for input(s): TSH, T4TOTAL, FREET4, T3FREE, THYROIDAB in the last 72 hours. Anemia Panel: No results for input(s): VITAMINB12, FOLATE, FERRITIN, TIBC, IRON, RETICCTPCT in the last 72 hours. Sepsis Labs: Recent Labs  Lab 12/10/19 1213 12/10/19 1411   LATICACIDVEN 0.9 1.0    Recent Results (from the past 240 hour(s))  Aerobic/Anaerobic Culture (surgical/deep wound)     Status: None (Preliminary result)   Collection Time: 12/10/19  7:26 PM   Specimen: Wound  Result Value Ref Range Status   Specimen Description   Final    WOUND Performed at [facility], [address] [place], [place] [address]    Special Requests   Final    NONE Performed at [facility], [address] [place], [place] [address]    Gram Stain   Final    NO WBC SEEN ABUNDANT GRAM POSITIVE COCCI ABUNDANT GRAM NEGATIVE RODS Performed at [facility], [place] [address] [place], [place] [address]    Culture PENDING  Incomplete   Report Status PENDING  Incomplete  SARS Coronavirus 2 by RT PCR (hospital order, performed in [facility] hospital lab) Nasopharyngeal Nasopharyngeal Swab     Status: None   Collection Time: 12/10/19 10:16 PM   Specimen: Nasopharyngeal Swab  Result Value Ref Range Status   SARS Coronavirus 2 NEGATIVE NEGATIVE Final    Comment: (NOTE) SARS-CoV-2 target nucleic acids are NOT DETECTED. The SARS-CoV-2 RNA is generally detectable in upper and lower respiratory specimens during the acute phase of infection. The lowest concentration of SARS-CoV-2 viral copies this assay can detect is 250 copies / mL. A negative result does not preclude SARS-CoV-2 infection and should not be used as the sole basis for treatment or other patient management decisions.  A negative result may occur with improper specimen collection / handling, submission of specimen other than nasopharyngeal swab, presence of viral mutation(s) within the areas targeted by this assay, and inadequate number of viral copies (<250 copies / mL). A negative result must be combined with clinical observations, patient history, and epidemiological information. Fact Sheet for Patients:   StrictlyIdeas.no Fact Sheet for Healthcare  Providers: BankingDealers.co.za This test is not yet approved or cleared  by the [place] FDA and has been authorized for detection and/or diagnosis of SARS-CoV-2 by FDA under an Emergency Use Authorization (EUA).  This EUA will remain in effect (meaning this test can be used) for the duration of the COVID-19 declaration under Section 564(b)(1) of the Act, 21 U.S.C. section 360bbb-3(b)(1), unless the authorization is terminated or revoked sooner. Performed at [facility], [address], [place], [place] [address]   Surgical pcr screen     Status: None   Collection Time: 12/11/19 12:11 AM   Specimen: Nasal Mucosa; Nasal Swab  Result Value Ref Range Status   MRSA, PCR NEGATIVE NEGATIVE Final   Staphylococcus aureus NEGATIVE NEGATIVE Final    Comment: (NOTE) The Xpert SA Assay (FDA approved for NASAL specimens in patients 60 years of age and older), is one component of a comprehensive surveillance program. It is not  intended to diagnose infection nor to guide or monitor treatment. Performed at [facility], [address], [place], [place] [address]          Radiology Studies: MR FOOT LEFT W WO CONTRAST  Result Date: 12/10/2019 CLINICAL DATA:  Wound around the fifth MTP joint. Evaluate for osteomyelitis. EXAM: MRI OF THE LEFT FOREFOOT WITHOUT AND WITH CONTRAST TECHNIQUE: Multiplanar, multisequence MR imaging of the left forefoot was performed both before and after administration of intravenous contrast. CONTRAST:  21m GADAVIST GADOBUTROL 1 MMOL/ML IV SOLN COMPARISON:  Left foot x-rays from same day. FINDINGS: Bones/Joint/Cartilage Abnormal marrow edema and enhancement with corresponding decreased T1 marrow signal involving the fifth metatarsal head and base of the fifth proximal phalanx, consistent with osteomyelitis. No fracture or dislocation. Mild edema within the third and fourth metatarsal shafts preserved T1 marrow signal, possibly  stress related. 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 Small second TMT joint effusion. Ligaments Collateral ligaments are intact.  Lisfranc ligament is intact. Muscles and Tendons Flexor, peroneal and extensor compartment tendons are intact. Extensive fatty atrophy of the intrinsic foot muscles. Soft tissue Ulceration at the lateral aspect of the fifth metatarsal head extending to bone. Small area of surrounding devitalized soft tissue with foci of subcutaneous emphysema. Dorsal lateral forefoot skin thickening and enhancement. No fluid collection or hematoma. No soft tissue mass. IMPRESSION: 1. Ulceration at the lateral aspect of the fifth metatarsal head extending to bone with underlying osteomyelitis of the fifth metatarsal head and fifth proximal phalanx. 2. Small area of devitalized soft tissue adjacent to the ulceration. Cellulitis of the dorsal lateral forefoot. No abscess. Electronically Signed   By: WTitus DubinM.D.   On: 12/10/2019 21:37   [place]Venous Img Lower Unilateral Left  Result Date: 12/10/2019 CLINICAL DATA:  Left lower extremity pain and swelling EXAM: LEFT LOWER EXTREMITY VENOUS DOPPLER ULTRASOUND TECHNIQUE: Gray-scale sonography with graded compression, as well as color Doppler and duplex ultrasound were performed to evaluate the lower extremity deep venous systems from the level of the common femoral vein and including the common femoral, femoral, profunda femoral, popliteal and calf veins including the posterior tibial, peroneal and gastrocnemius veins when visible. The superficial great saphenous vein was also interrogated. Spectral Doppler was utilized to evaluate flow at rest and with distal augmentation maneuvers in the common femoral, femoral and popliteal veins. COMPARISON:  None. FINDINGS: Contralateral Common Femoral Vein: Respiratory phasicity is normal and symmetric with the symptomatic side. No evidence of thrombus. Normal compressibility.  Common Femoral Vein: No evidence of thrombus. Normal compressibility, respiratory phasicity and response to augmentation. Saphenofemoral Junction: No evidence of thrombus. Normal compressibility and flow on color Doppler imaging. Profunda Femoral Vein: No evidence of thrombus. Normal compressibility and flow on color Doppler imaging. Femoral Vein: No evidence of thrombus. Normal compressibility, respiratory phasicity and response to augmentation. Popliteal Vein: No evidence of thrombus. Normal compressibility, respiratory phasicity and response to augmentation. Calf Veins: No evidence of thrombus. Normal compressibility and flow on color Doppler imaging. Superficial Great Saphenous Vein: No evidence of thrombus. Normal compressibility. Venous Reflux:  None. Other Findings:  None. IMPRESSION: No evidence of left lower extremity deep venous thrombosis. Electronically Signed   By: NDavina PokeD.O.   On: 12/10/2019 17:18   DG Foot Complete Left  Result Date: 12/10/2019 CLINICAL DATA:  Laceration, infection EXAM: LEFT FOOT - COMPLETE 3+ VIEW COMPARISON:  None. FINDINGS: No fracture or dislocation of the left foot. There is a soft  tissue wound overlying the distal fifth metatarsal and metatarsophalangeal joint overlying bandage material and some evidence of subcutaneous emphysema. Mild first metatarsophalangeal arthrosis. Soft tissue edema. Vascular calcinosis. IMPRESSION: 1. No fracture or dislocation of the left foot. No radiopaque foreign body. 2. There is a soft tissue wound overlying the distal fifth metatarsal and metatarsophalangeal joint. There is some evidence of subcutaneous emphysema, concerning for gas-forming infection. Electronically Signed   By: Eddie Candle M.D.   On: 12/10/2019 12:43        Scheduled Meds: . amLODipine  10 mg Oral Daily  . atorvastatin  20 mg Oral QPM  . DULoxetine  30 mg Oral Daily  . enoxaparin (LOVENOX) injection  40 mg Subcutaneous Q24H  . gabapentin  800 mg Oral  TID  . insulin aspart  0-15 Units Subcutaneous TID WC  . metoprolol tartrate  25 mg Oral BID  . nicotine  14 mg Transdermal Daily  . sodium chloride flush  3 mL Intravenous Q12H   Continuous Infusions: . ampicillin-sulbactam (UNASYN) IV 3 g (12/11/19 0153)  . vancomycin       LOS: 1 day    Time spent: 32 mins     Wyvonnia Dusky, MD [facility] Pager [phone]  If 7PM-7AM, please contact night-coverage [email or web address] 12/11/2019, 7:56 AM

## 2019-12-11 NOTE — Anesthesia Procedure Notes (Signed)
Procedure Name: LMA Insertion Date/Time: 12/11/2019 5:17 PM Performed by: Stormy Fabian, CRNA Pre-anesthesia Checklist: Patient identified, Patient being monitored, Timeout performed, Emergency Drugs available and Suction available Patient Re-evaluated:Patient Re-evaluated prior to induction Oxygen Delivery Method: Circle system utilized Preoxygenation: Pre-oxygenation with 100% oxygen Induction Type: IV induction Ventilation: Mask ventilation without difficulty LMA: LMA inserted LMA Size: 4.0 Tube type: Oral Number of attempts: 1 Placement Confirmation: positive ETCO2 and breath sounds checked- equal and bilateral Tube secured with: Tape Dental Injury: Teeth and Oropharynx as per pre-operative assessment

## 2019-12-11 NOTE — Anesthesia Preprocedure Evaluation (Addendum)
Anesthesia Evaluation  Patient identified by MRN, date of birth, ID band Patient awake    Reviewed: Allergy & Precautions, NPO status , Patient's Chart, lab work & pertinent test results  History of Anesthesia Complications Negative for: history of anesthetic complications  Airway Mallampati: II  TM Distance: >3 FB Neck ROM: Full    Dental  (+) Edentulous Upper, Edentulous Lower   Pulmonary neg sleep apnea, COPD,  COPD inhaler, Current Smoker and Patient abstained from smoking.,           Cardiovascular hypertension, Pt. on medications (-) angina+ CAD, + Past MI and + Cardiac Stents (2005)       Neuro/Psych neg Seizures negative neurological ROS  negative psych ROS   GI/Hepatic negative GI ROS, (+) Hepatitis -, C  Endo/Other  diabetes, Insulin Dependent  Renal/GU negative Renal ROS     Musculoskeletal  (+) Arthritis ,   Abdominal (+) - obese,   Peds  Hematology negative hematology ROS (+)   Anesthesia Other Findings Past Medical History: No date: COPD (chronic obstructive pulmonary disease) (HCC) No date: Diabetes mellitus without complication (HCC) No date: Hypercholesteremia No date: Hypertension No date: Myocardial infarction (HCC)   Reproductive/Obstetrics                            Anesthesia Physical Anesthesia Plan  ASA: III  Anesthesia Plan: General   Post-op Pain Management:    Induction: Intravenous  PONV Risk Score and Plan: 0 and Ondansetron  Airway Management Planned: LMA  Additional Equipment:   Intra-op Plan:   Post-operative Plan:   Informed Consent: I have reviewed the patients History and Physical, chart, labs and discussed the procedure including the risks, benefits and alternatives for the proposed anesthesia with the patient or authorized representative who has indicated his/her understanding and acceptance.     Dental advisory given  Plan  Discussed with: CRNA and Anesthesiologist  Anesthesia Plan Comments:         Anesthesia Quick Evaluation

## 2019-12-11 NOTE — Op Note (Addendum)
PODIATRY / FOOT AND ANKLE SURGERY OPERATIVE REPORT    SURGEON: Caroline More, DPM  PRE-OPERATIVE DIAGNOSIS:  1.  Osteomyelitis left fifth metatarsal phalangeal joint with associated ulceration subfifth metatarsal phalangeal joint 2.  Cellulitis left foot 3.  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  Diabetes type 2 polyneuropathy 5.  PVD with history of smoking  POST-OPERATIVE DIAGNOSIS: Same  PROCEDURE(S): 1. Left partial fifth ray amputation with rotational flap closure  HEMOSTASIS: Left ankle tourniquet, around 30 minutes of inflation  ANESTHESIA: MAC  ESTIMATED BLOOD LOSS: 20 cc  FINDING(S): 1.  Left fifth metatarsal phalangeal joint osteomyelitis with gas gangrene in the tissues at the level of that joint but with no further proximal extension  PATHOLOGY/SPECIMEN(S): Left partial fifth ray amputation with proximal margin marked in purple ink.  Culture taken of soft tissue  INDICATIONS:   Edward Richard is a 64 y.o. male who presents with an ulceration present to the plantar lateral aspect of the left fifth metatarsal phalangeal joint.  Patient was seen in the emergency room for this condition.  Patient noted that this has been going on since about Thursday when he scraped his foot on a brick.  He noticed that the foot has become more red, hot, swollen and has a foul odor.  He was seen in the emergency room where x-rays showed osteomyelitic changes to the left fifth metatarsal phalangeal joint and an MRI was performed showing that as well with some gas in the tissues.  A bedside I&D and debridement was performed on 6/7 on the date of his admission and Betadine dressing was applied.  Patient had revascularization procedure with vascular surgery today and now has been optimized to heal the amputation.  Patient is an uncontrolled diabetic and has a history of PVD as well as significant past medical history of smoking all of which will make wound healing difficult.   Discussed all treatment options with the patient both conservative and surgical attempts at correction at this time patient has elected for procedure consisting of left partial fifth ray amputation..  DESCRIPTION: After obtaining full informed written consent, the patient was brought back to the operating room and placed supine upon the operating table.  The patient received IV antibiotics prior to induction.  After obtaining adequate anesthesia, a preoperative block was performed with 20 cc of half percent Marcaine plain.  A ankle tourniquet was applied.  The patient was prepped and draped in the standard fashion.  An Esmarch bandage was used to exsanguinate the left lower extremity and the pneumatic ankle tourniquet was inflated.  Attention was then directed to the lateral aspect of the left foot at the fifth metatarsal level where the ulceration was apparent for the ulceration was circumferentially incised to the level of bone.  The incision was then extended proximally to the fifth metatarsal proximal shaft area and then extended distally around the fifth toe in a racquet type of incision.  This incision was made full-thickness and made straight to bone at all levels.  The ulceration area was excised and passed off the operative site.  The fifth metatarsal phalangeal joint was identified and an extensor tenotomy and capsulotomy was performed followed by release of the collateral and suspensory ligaments as well as any connection of the plantar plate and flexor tendon.  The fifth toe was then disarticulated and passed off the operative site and sent off for pathology.  The fifth metatarsal phalangeal joint appeared to be grossly infected with osteomyelitic changes  as the bone appeared to be very soft in nature and the bone appeared to be gray.  The soft tissues that remained appeared to be somewhat healthy and viable but it remained questionable at the digital flap area that was made.  A curette was used to  debride some of this tissue to more healthy looking tissue but it still appeared to have a gray and dusky type look.  Circumferential dissection was then performed around the fifth metatarsal to the midshaft level.  A sagittal bone saw was used to resect the fifth metatarsal with the appropriate beveling leaving more dorsal medial than plantar lateral.  The fifth metatarsal distally was then removed and passed off the operative site and the proximal margin was marked in purple ink and sent off for pathology.  A wound culture was also taken at this time.  The surgical site was flushed with copious amounts normal sterile saline.  Any bleeding vessels were cauterized as necessary during this process.  Vancomycin powder was then sprinkled into the wound.  The digital flap as well as full-thickness flaps were then rotated to cover the previous void of the ulcer and held in place starting with the subcutaneous tissue and deep structures was reapproximated well coapted with a few 3-0 Vicryl.  The skin was then reapproximated well coapted with 3-0 nylon in combination of simple and horizontal mattress type stitching.  The wound was completely closed.    The pneumatic ankle tourniquet was deflated and a prompt hyperemic response was noted all digits left foot.  A postoperative dressing was applied consisting of Betadine to the incision line followed by Xeroform, 4 x 4 gauze, ABD, Kerlix, Ace wrap with mild compression.  Hemostasis appeared to be achieved with dressing check.  The patient tolerated the procedure and anesthesia well and was transferred to the recovery room vital signs stable and vascular status remaining intact to left foot.  Patient will be discharged back to the inpatient room with the appropriate recommendations and orders in place.  COMPLICATIONS: None  CONDITION: Good, stable  Caroline More. DPM

## 2019-12-11 NOTE — Transfer of Care (Signed)
Immediate Anesthesia Transfer of Care Note  Patient: Edward Richard  Procedure(s) Performed: INCISION AND DRAINAGE (Left Foot) AMPUTATION RAY 5TH RAY PARTIAL (Left Toe)  Patient Location: PACU  Anesthesia Type:General  Level of Consciousness: drowsy  Airway & Oxygen Therapy: Patient Spontanous Breathing and Patient connected to face mask oxygen  Post-op Assessment: Report given to RN and Post -op Vital signs reviewed and stable  Post vital signs: Reviewed and stable  Last Vitals:  Vitals Value Taken Time  BP 104/50 12/11/19 1809  Temp 37.3 C 12/11/19 1809  Pulse 68 12/11/19 1818  Resp 20 12/11/19 1818  SpO2 98 % 12/11/19 1818  Vitals shown include unvalidated device data.  Last Pain:  Vitals:   12/11/19 1547  TempSrc: Temporal  PainSc: 6       Patients Stated Pain Goal: 3 (12/11/19 0505)  Complications: No apparent anesthesia complications

## 2019-12-11 NOTE — Progress Notes (Addendum)
Inpatient Diabetes Program Recommendations  AACE/ADA: New Consensus Statement on Inpatient Glycemic Control (2015)  Target Ranges:  Prepandial:   less than 140 mg/dL      Peak postprandial:   less than 180 mg/dL (1-2 hours)      Critically ill patients:  140 - 180 mg/dL   Results for JEREMI, LOSITO (MRN [id number]) as of 12/11/2019 10:40  Ref. Range 12/10/2019 12:18 12/10/2019 21:40 12/11/2019 08:20  Glucose-Capillary Latest Ref Range: 70 - 99 mg/dL 291 (H) 916 (H) 606 (H)  5  units NOVOLOG    Results for SWADE, SHONKA (MRN [id number]) as of 12/11/2019 10:40  Ref. Range 12/10/2019 16:26  Hemoglobin A1C Latest Ref Range: 4.8 - 5.6 % 9.2 (H)  (217 mg/dl)    Admit: Left foot wound & cellulitis  History: DM, COPD  Home DM Meds: Lantus 40 units BID        Farxiga 10 mg Daily  Current Orders: Novolog Moderate Correction Scale/ SSI (0-15 units) TID AC  Endocrinologist: Starla Link, NP with [facility] Monday seen 08/29/2019--Was instructed to Continue Lantus 40 units BID/ Restart Metformin 1000 mg BID/ ContinueFarxiga10 mgDaily  A1c was 11.5% in December 2020 per [facility]--Down to 9.2% this admission    Novolog just started this AM.  NPO this AM for left partial fifth ray amputation with removal of all nonviable necrotic tissue with possible incision and drainage.   MD- May consider adding portion of home dose of Lantus to current inpatient insulin regimen:  Could start with Lantus 10 units BID (25% total home dose) and titrate upward as needed based on CBGs    --Will follow patient during hospitalization--  Ambrose Finland RN, MSN, CDE Diabetes Coordinator Inpatient Glycemic Control Team Team Pager: [phone] (8a-5p)

## 2019-12-11 NOTE — Progress Notes (Signed)
PHARMACY - PHYSICIAN COMMUNICATION CRITICAL VALUE ALERT - BLOOD CULTURE IDENTIFICATION (BCID)  Edward Richard is an 64 y.o. male who presented to [facility] on 12/10/2019 with a chief complaint of Osteomyelitis, Cellulitis  Assessment:  MRSA growing in 1 of 2 bottles (anaerobic) (include suspected source if known)  Name of physician (or Provider) Contacted: Wilfred Lacy  Current antibiotics: Ceftriaxone and Vancomycin   Changes to prescribed antibiotics recommended:  MD wants to see results from wound culture.  Will continue with current abx regimen.   Results for orders placed or performed during the hospital encounter of 12/10/19  Blood Culture ID Panel (Reflexed) (Collected: 12/10/2019 12:13 PM)  Result Value Ref Range   Enterococcus species NOT DETECTED NOT DETECTED   Listeria monocytogenes NOT DETECTED NOT DETECTED   Staphylococcus species DETECTED (A) NOT DETECTED   Staphylococcus aureus (BCID) DETECTED (A) NOT DETECTED   Methicillin resistance DETECTED (A) NOT DETECTED   Streptococcus species NOT DETECTED NOT DETECTED   Streptococcus agalactiae NOT DETECTED NOT DETECTED   Streptococcus pneumoniae NOT DETECTED NOT DETECTED   Streptococcus pyogenes NOT DETECTED NOT DETECTED   Acinetobacter baumannii NOT DETECTED NOT DETECTED   Enterobacteriaceae species NOT DETECTED NOT DETECTED   Enterobacter cloacae complex NOT DETECTED NOT DETECTED   Escherichia coli NOT DETECTED NOT DETECTED   Klebsiella oxytoca NOT DETECTED NOT DETECTED   Klebsiella pneumoniae NOT DETECTED NOT DETECTED   Proteus species NOT DETECTED NOT DETECTED   Serratia marcescens NOT DETECTED NOT DETECTED   Haemophilus influenzae NOT DETECTED NOT DETECTED   Neisseria meningitidis NOT DETECTED NOT DETECTED   Pseudomonas aeruginosa NOT DETECTED NOT DETECTED   Candida albicans NOT DETECTED NOT DETECTED   Candida glabrata NOT DETECTED NOT DETECTED   Candida krusei NOT DETECTED NOT DETECTED   Candida parapsilosis NOT  DETECTED NOT DETECTED   Candida tropicalis NOT DETECTED NOT DETECTED    Edward Richard 12/11/2019  5:20 PM

## 2019-12-11 NOTE — Op Note (Signed)
[facility]  Percutaneous Study/Intervention Procedural Note   Date of Surgery: 12/11/2019,3:06 PM  Surgeon:Ulices Maack, Dolores Lory   Pre-operative Diagnosis: Atherosclerotic occlusive disease bilateral lower extremities with ulceration and osteomyelitis of the left fifth ray  Post-operative diagnosis:  Same  Procedure(s) Performed:  1.  Abdominal aortogram  2.  Left lower extremity distal runoff third order catheter placement  3.  Star close right common femoral    Anesthesia: Conscious sedation was administered by the interventional radiology RN under my direct supervision. IV Versed plus fentanyl were utilized. Continuous ECG, pulse oximetry and blood pressure was monitored throughout the entire procedure.  Conscious sedation was administered for a total of 45 minutes.  Sheath: 5 French 11 cm Pinnacle retrograde right common femoral  Contrast: 50 cc   Fluoroscopy Time: 3.4 minutes  Indications:  The patient presents to [facility] with gangrenous changes to the left foot and heel.  Pedal pulses are nonpalpable bilaterally suggesting atherosclerotic occlusive disease.  The risks and benefits as well as alternative therapies for lower extremity revascularization are reviewed with the patient all questions are answered the patient agrees to proceed.  The patient is therefore undergoing angiography with the hope for intervention for limb salvage.   Procedure:  Edward Richard a 64 y.o. male who was identified and appropriate procedural time out was performed.  The patient was then placed supine on the table and prepped and draped in the usual sterile fashion.  Ultrasound was used to evaluate the right common femoral artery.  It was echolucent and pulsatile indicating it is patent .  An ultrasound image was acquired for the permanent record.  A micropuncture needle was used to access the right common femoral artery under direct ultrasound guidance.  The microwire  was then advanced under fluoroscopic guidance without difficulty followed by the micro-sheath.  A 0.035 J wire was advanced without resistance and a 5Fr sheath was placed.    Pigtail catheter was then advanced to the level of T12 and AP projection of the aorta was obtained. Pigtail catheter was then repositioned to above the bifurcation and RAO view of the pelvis was obtained. Stiff angled Glidewire and pigtail catheter was then used across the bifurcation and the catheter was positioned in the distal external iliac artery.  LAO of the left groin was then obtained. Wire was reintroduced and negotiated into the SFA and the catheter was advanced into the SFA. Distal runoff was then performed.  After review of the images the catheter was removed over wire and an RAO view of the groin was obtained. StarClose device was deployed without difficulty.   Findings:   Aortogram: The abdominal aorta is opacified with a bolus injection contrast.  There are mild atherosclerotic changes but no hemodynamically significant lesions.  Of note there is a single left nephrogram with a single renal artery no evidence of renal artery stenosis.  Bilateral common internal and external iliac arteries are patent there are no hemodynamically significant stenoses identified.  Right Lower Extremity: The right common femoral and visualized segments of the proximal SFA and profunda femoris are widely patent  Left Lower Extremity: The left common femoral and profunda femoris are widely patent.  No evidence of hemodynamically significant stenosis.  The left SFA is patent at Hunter's canal there is a 20 to 30% narrowing consistent with calcific plaque.  This does not appear to be flow-limiting and the lesion was visualized in AP as well as bilateral oblique oblique views.  The  popliteal artery is widely patent as is the trifurcation.  The anterior tibial tibioperoneal trunk and peroneal are widely patent and free of any hemodynamically  significant lesions down to the foot filling the pedal arch.  The posterior tibial is patent in its proximal few centimeters and then demonstrates a 3 to 4 cm segment of diffuse greater than 80% stenosis.  Below this the posterior tibial is reconstituted and has a normal caliber and fills the medial and lateral plantar vessels.  On magnified imaging of the forefoot the anterior tibial contributes directly to the second third fourth and fifth rays.  The posterior tibial contributes to the heel and the first ray predominantly.  Summary: There is inline flow from the aorta to the left foot via the anterior tibial which is the most direct filling of the area of infection.  The posterior tibial does not contribute significantly and therefore I did not feel that intervention was indicated.  Disposition: Patient was taken to the recovery room in stable condition having tolerated the procedure well.  Edward Richard 12/11/2019,3:06 PM

## 2019-12-11 NOTE — Consult Note (Signed)
Pharmacy Antibiotic Note  Edward Richard is a 64 y.o. 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 Presented to ED with nonhealing foot wound.  Pharmacy has been consulted for Vancomycin dosing. Patient also receiving Unasyn currently.  Plan: Day 2 antibiotics  Goal trough 15-20 in the setting of osteomyelitis.  Per [facility], will adjust vancomycin maintenance dose to 1000 mg IV BID.   Expected AUC: 528.3 Expected Css: 15.7 SCr used: 1.07   Height: 5\' 9"  (175.3 cm) Weight: 83.4 kg (183 lb 13.8 oz) IBW/kg (Calculated) : 70.7  Temp (24hrs), Avg:99.2 F (37.3 C), Min:98.5 F (36.9 C), Max:100.7 F (38.2 C)  Recent Labs  Lab 12/10/19 1213 12/10/19 1411 12/11/19 0530  WBC 13.5*  --  8.8  CREATININE 1.08  --  1.07  LATICACIDVEN 0.9 1.0  --     Estimated Creatinine Clearance: 70.7 mL/min (by C-G formula based on SCr of 1.07 mg/dL).    No Known Allergies  Antimicrobials this admission: Vancomycin 6/7 >>  Unasyn 6/7 >> Ceftriaxone 6/7 x 1  Microbiology results: 6/7 BCx: NG < 24 hours 6/8 MRSA PCR:  negative 6/7 Wound cx: pending (abundant GPC/GNR)  6/7 COVID (nasopharyngeal swab):  negative  Thank you for allowing pharmacy to be a part of this patient's care.  8/7, PharmD 12/11/2019 10:04 AM

## 2019-12-11 NOTE — Consult Note (Signed)
[facility] Vascular Consult Note  MRN : [id number]  Edward Richard is a 64 y.o. (Jul 06, 1955) male who presents with chief complaint of  Chief Complaint  Patient presents with  . Wound Infection   History of Present Illness:  The patient is a 64 year old male with multiple medical issues (see below) who presented to the [facility]'s emergency department at the recommendation of his podiatrist for a nonhealing left foot wound.  Patient endorses a history of trauma to his left foot on Thursday.  States he scraped his foot on a brick.  Patient noticed progressively worsening foot pain.  Also, a foul odor emanating from the wound.  Denies any fever, nausea vomiting.  Denies any shortness of breath or chest pain.  Denies any claudication-like symptoms.  Patient was seen in the ED and x-rays were taken showing osteomyelitic changes to the fifth metatarsal phalangeal joint as well as gas in the subcutaneous tissues.    Vascular surgery was consulted by Dr. Excell SeltzerBaker for possible endovascular revascularization.  Current Facility-Administered Medications  Medication Dose Route Frequency Provider Last Rate Last Admin  . acetaminophen (TYLENOL) tablet 650 mg  650 mg Oral Q6H PRN Charise KillianWilliams, Jamiese M, MD       Or  . acetaminophen (TYLENOL) suppository 650 mg  650 mg Rectal Q6H PRN Charise KillianWilliams, Jamiese M, MD      . amLODipine (NORVASC) tablet 10 mg  10 mg Oral Daily Charise KillianWilliams, Jamiese M, MD   10 mg at 12/10/19 1804  . Ampicillin-Sulbactam (UNASYN) 3 g in sodium chloride 0.9 % 100 mL IVPB  3 g Intravenous Q6H Charise KillianWilliams, Jamiese M, MD 200 mL/hr at 12/11/19 0907 3 g at 12/11/19 0907  . atorvastatin (LIPITOR) tablet 20 mg  20 mg Oral QPM Hallaji, Sheema M, RPH      . bisacodyl (DULCOLAX) EC tablet 5 mg  5 mg Oral Daily PRN Charise KillianWilliams, Jamiese M, MD      . DULoxetine (CYMBALTA) DR capsule 30 mg  30 mg Oral Daily Charise KillianWilliams, Jamiese M, MD   30 mg at 12/10/19 1804  .  enoxaparin (LOVENOX) injection 40 mg  40 mg Subcutaneous Q24H Fabienne BrunsWilliams, Jamiese M, MD      . gabapentin (NEURONTIN) capsule 800 mg  800 mg Oral TID Charise KillianWilliams, Jamiese M, MD   800 mg at 12/11/19 0022  . hydrALAZINE (APRESOLINE) tablet 50 mg  50 mg Oral Q6H PRN Charise KillianWilliams, Jamiese M, MD      . HYDROmorphone (DILAUDID) injection 0.5 mg  0.5 mg Intravenous Q4H PRN Charise KillianWilliams, Jamiese M, MD   0.5 mg at 12/11/19 0505  . insulin aspart (novoLOG) injection 0-15 Units  0-15 Units Subcutaneous TID WC Charise KillianWilliams, Jamiese M, MD   5 Units at 12/11/19 [phone]  . metoprolol tartrate (LOPRESSOR) tablet 25 mg  25 mg Oral BID Charise KillianWilliams, Jamiese M, MD   25 mg at 12/11/19 0853  . nicotine (NICODERM CQ - dosed in mg/24 hours) patch 14 mg  14 mg Transdermal Daily Charise KillianWilliams, Jamiese M, MD   14 mg at 12/10/19 1804  . ondansetron (ZOFRAN) tablet 4 mg  4 mg Oral Q6H PRN Charise KillianWilliams, Jamiese M, MD       Or  . ondansetron [facility](ZOFRAN) injection 4 mg  4 mg Intravenous Q6H PRN Charise KillianWilliams, Jamiese M, MD      . oxyCODONE (Oxy IR/ROXICODONE) immediate release tablet 10 mg  10 mg Oral Q6H PRN Charise KillianWilliams, Jamiese M, MD   10 mg at 12/11/19 0853  .  sodium chloride flush (NS) 0.9 % injection 3 mL  3 mL Intravenous Q12H Charise Killian, MD   3 mL at 12/11/19 1030  . vancomycin (VANCOCIN) IVPB 1000 mg/200 mL premix  1,000 mg Intravenous Q12H Cherly Hensen, RPH 200 mL/hr at 12/11/19 1111 1,000 mg at 12/11/19 1111   Past Medical History:  Diagnosis Date  . COPD (chronic obstructive pulmonary disease) (HCC)   . Diabetes mellitus without complication (HCC)   . Hypercholesteremia   . Hypertension   . Myocardial infarction [facility])    Past Surgical History:  Procedure Laterality Date  . NO PAST SURGERIES     Social History Social History   Tobacco Use  . Smoking status: Current Every Day Smoker    Packs/day: 0.50    Types: Cigarettes  . Smokeless tobacco: Never Used  Substance Use Topics  . Alcohol use: Yes  . Drug use: No   Family  History Family History  Problem Relation Age of Onset  . Cancer Mother   . Heart failure Father   . Cancer Father   Denies family history of peripheral artery disease, venous disease or renal disease.  No Known Allergies  REVIEW OF SYSTEMS (Negative unless checked)  Constitutional: [] Weight loss  [] Fever  [] Chills Cardiac: [] Chest pain   [] Chest pressure   [] Palpitations   [] Shortness of breath when laying flat   [] Shortness of breath at rest   [] Shortness of breath with exertion. Vascular:  [] Pain in legs with walking   [] Pain in legs at rest   [] Pain in legs when laying flat   [] Claudication   [x] Pain in feet when walking  [] Pain in feet at rest  [x] Pain in feet when laying flat   [] History of DVT   [] Phlebitis   [] Swelling in legs   [] Varicose veins   [] Non-healing ulcers Pulmonary:   [] Uses home oxygen   [] Productive cough   [] Hemoptysis   [] Wheeze  [] COPD   [] Asthma Neurologic:  [] Dizziness  [] Blackouts   [] Seizures   [] History of stroke   [] History of TIA  [] Aphasia   [] Temporary blindness   [] Dysphagia   [] Weakness or numbness in arms   [] Weakness or numbness in legs Musculoskeletal:  [] Arthritis   [] Joint swelling   [] Joint pain   [] Low back pain Hematologic:  [] Easy bruising  [] Easy bleeding   [] Hypercoagulable state   [] Anemic  [] Hepatitis Gastrointestinal:  [] Blood in stool   [] Vomiting blood  [] Gastroesophageal reflux/heartburn   [] Difficulty swallowing. Genitourinary:  [] Chronic kidney disease   [] Difficult urination  [] Frequent urination  [] Burning with urination   [] Blood in urine Skin:  [] Rashes   [x] Ulcers   [x] Wounds Psychological:  [] History of anxiety   []  History of major depression.  Physical Examination  Vitals:   12/10/19 2330 12/10/19 2354 12/11/19 0000 12/11/19 0822  BP: (!) 107/47 (!) 148/67  140/70  Pulse: 70 86  72  Resp:  19  16  Temp:  98.5 F (36.9 C)  98.8 F (37.1 C)  TempSrc:  Oral    SpO2:  99%  97%  Weight:   83.4 kg   Height:       Body  mass index is 27.15 kg/m. Gen:  WD/WN, NAD Head: [place]/AT, No temporalis wasting. Prominent temp pulse not noted. Ear/Nose/Throat: Hearing grossly intact, nares w/o erythema or drainage, oropharynx w/o Erythema/Exudate Eyes: Sclera non-icteric, conjunctiva clear Neck: Trachea midline.  No JVD.  Pulmonary:  Good air movement, respirations not labored, equal bilaterally.  Cardiac: RRR, normal  S1, S2. Vascular:  Vessel Right Left  Radial Palpable Palpable  Ulnar Palpable Palpable  Brachial Palpable Palpable  Carotid Palpable, without bruit Palpable, without bruit  Aorta Not palpable N/A  Femoral Palpable Palpable  Popliteal Palpable Palpable  PT Palpable Non-Palpable  DP Palpable Non-Palpable   Left lower extremity: Thigh soft.  Calf soft.  Hard to palpate pedal pulses.  Patient is warm distally to toes.  Ulceration noted to the lateral aspect just distal to the fifth toe.    Document Information Photos  L foot wound  12/10/2019 18:19  Attached To:  Hospital Encounter on 12/10/19  Source Information Rosetta Posner, [place]  [facility]-Emergency Department   Gastrointestinal: soft, non-tender/non-distended. No guarding/reflex.  Musculoskeletal: M/S 5/5 throughout.  Extremities without ischemic changes.  No deformity or atrophy. No edema. Neurologic: Sensation grossly intact in extremities.  Symmetrical.  Speech is fluent. Motor exam as listed above. Psychiatric: Judgment intact, Mood & affect appropriate for pt's clinical situation. Dermatologic: As above Lymph : No Cervical, Axillary, or Inguinal lymphadenopathy.  CBC Lab Results  Component Value Date   WBC 8.8 12/11/2019   HGB 10.0 (L) 12/11/2019   HCT 30.6 (L) 12/11/2019   MCV 89.7 12/11/2019   PLT 294 12/11/2019   BMET    Component Value Date/Time   NA 134 (L) 12/11/2019 0530   NA 136 06/05/2014 2317   K 4.4 12/11/2019 0530   K 3.6 06/05/2014 2317   CL 103 12/11/2019 0530   CL 100 06/05/2014 2317   CO2 25 12/11/2019 0530    CO2 26 06/05/2014 2317   GLUCOSE 250 (H) 12/11/2019 0530   GLUCOSE 407 (H) 06/05/2014 2317   BUN 20 12/11/2019 0530   BUN 14 06/05/2014 2317   CREATININE 1.07 12/11/2019 0530   CREATININE 1.06 06/05/2014 2317   CALCIUM 7.8 (L) 12/11/2019 0530   CALCIUM 9.1 06/05/2014 2317   GFRNONAA >60 12/11/2019 0530   GFRNONAA >60 06/05/2014 2317   GFRAA >60 12/11/2019 0530   GFRAA >60 06/05/2014 2317   Estimated Creatinine Clearance: 70.7 mL/min (by C-G formula based on SCr of 1.07 mg/dL).  COAG Lab Results  Component Value Date   INR 1.1 12/10/2019   Radiology MR FOOT LEFT W WO CONTRAST  Result Date: 12/10/2019 CLINICAL DATA:  Wound around the fifth MTP joint. Evaluate for osteomyelitis. EXAM: MRI OF THE LEFT FOREFOOT WITHOUT AND WITH CONTRAST TECHNIQUE: Multiplanar, multisequence MR imaging of the left forefoot was performed both before and after administration of intravenous contrast. CONTRAST:  35mL GADAVIST GADOBUTROL 1 MMOL/ML IV SOLN COMPARISON:  Left foot x-rays from same day. FINDINGS: Bones/Joint/Cartilage Abnormal marrow edema and enhancement with corresponding decreased T1 marrow signal involving the fifth metatarsal head and base of the fifth proximal phalanx, consistent with osteomyelitis. No fracture or dislocation. Mild edema within the third and fourth metatarsal shafts preserved T1 marrow signal, possibly stress related. 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 Small second TMT joint effusion. Ligaments Collateral ligaments are intact.  Lisfranc ligament is intact. Muscles and Tendons Flexor, peroneal and extensor compartment tendons are intact. Extensive fatty atrophy of the intrinsic foot muscles. Soft tissue Ulceration at the lateral aspect of the fifth metatarsal head extending to bone. Small area of surrounding devitalized soft tissue with foci of subcutaneous emphysema. Dorsal lateral forefoot skin thickening and enhancement. No fluid collection or  hematoma. No soft tissue mass. IMPRESSION: 1. Ulceration at the lateral aspect of the fifth metatarsal head extending to bone with underlying osteomyelitis  of the fifth metatarsal head and fifth proximal phalanx. 2. Small area of devitalized soft tissue adjacent to the ulceration. Cellulitis of the dorsal lateral forefoot. No abscess. Electronically Signed   By: Obie Dredge M.D.   On: 12/10/2019 21:37   US Venous Img Lower Unilateral Left  Result Date: 12/10/2019 CLINICAL DATA:  Left lower extremity pain and swelling EXAM: LEFT LOWER EXTREMITY VENOUS DOPPLER ULTRASOUND TECHNIQUE: Gray-scale sonography with graded compression, as well as color Doppler and duplex ultrasound were performed to evaluate the lower extremity deep venous systems from the level of the common femoral vein and including the common femoral, femoral, profunda femoral, popliteal and calf veins including the posterior tibial, peroneal and gastrocnemius veins when visible. The superficial great saphenous vein was also interrogated. Spectral Doppler was utilized to evaluate flow at rest and with distal augmentation maneuvers in the common femoral, femoral and popliteal veins. COMPARISON:  None. FINDINGS: Contralateral Common Femoral Vein: Respiratory phasicity is normal and symmetric with the symptomatic side. No evidence of thrombus. Normal compressibility. Common Femoral Vein: No evidence of thrombus. Normal compressibility, respiratory phasicity and response to augmentation. Saphenofemoral Junction: No evidence of thrombus. Normal compressibility and flow on color Doppler imaging. Profunda Femoral Vein: No evidence of thrombus. Normal compressibility and flow on color Doppler imaging. Femoral Vein: No evidence of thrombus. Normal compressibility, respiratory phasicity and response to augmentation. Popliteal Vein: No evidence of thrombus. Normal compressibility, respiratory phasicity and response to augmentation. Calf Veins: No evidence of  thrombus. Normal compressibility and flow on color Doppler imaging. Superficial Great Saphenous Vein: No evidence of thrombus. Normal compressibility. Venous Reflux:  None. Other Findings:  None. IMPRESSION: No evidence of left lower extremity deep venous thrombosis. Electronically Signed   By: Duanne Guess D.O.   On: 12/10/2019 17:18   DG Foot Complete Left  Result Date: 12/10/2019 CLINICAL DATA:  Laceration, infection EXAM: LEFT FOOT - COMPLETE 3+ VIEW COMPARISON:  None. FINDINGS: No fracture or dislocation of the left foot. There is a soft tissue wound overlying the distal fifth metatarsal and metatarsophalangeal joint overlying bandage material and some evidence of subcutaneous emphysema. Mild first metatarsophalangeal arthrosis. Soft tissue edema. Vascular calcinosis. IMPRESSION: 1. No fracture or dislocation of the left foot. No radiopaque foreign body. 2. There is a soft tissue wound overlying the distal fifth metatarsal and metatarsophalangeal joint. There is some evidence of subcutaneous emphysema, concerning for gas-forming infection. Electronically Signed   By: Lauralyn Primes M.D.   On: 12/10/2019 12:43   Assessment/Plan The patient is a 64 year old male with multiple medical issues who sustained trauma on Thursday to the lateral aspect of his foot.  Patient with progressive worsening of the wound.  1.  Possible atherosclerotic disease with wound formation: Denies any history of known peripheral artery disease.  Denies any claudication-like symptoms.  Patient does present with chronic wound which is progressively worsening over the last few days.  Hard to palpate pedal pulses.  Patient with multiple risk factors for peripheral artery disease.  In the setting of multiple risk factors with progressively worsening wound recommend undergoing angiogram with possible intervention and attempt to assess the patient's anatomy and contributing degree of peripheral artery disease.  If appropriate, an  attempt to revascularize the leg can be made at that time.  Procedure, risks and benefits explained to the patient.  All questions answered.  The patient wishes to proceed.  We will plan on this tomorrow with Dr. Gilda Crease  2.  Diabetes: Uncontrolled. Encouraged good control as  its slows the progression of atherosclerotic disease  3. Tobacco Abuse: We had a discussion for approximately three minutes regarding the absolute need for smoking cessation due to the deleterious nature of tobacco on the vascular system. We discussed the tobacco use would diminish patency of any intervention, and likely significantly worsen progression of disease. We discussed multiple agents for quitting including replacement therapy or medications to reduce cravings such as Chantix. The patient voices their understanding of the importance of smoking cessation.  Discussed with Dr. Francene Castle, PA-C  12/11/2019 12:39 PM  This note was created with Dragon medical transcription system.  Any error is purely unintentional

## 2019-12-11 NOTE — H&P (Signed)
.  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  The various methods of treatment have been discussed with the patient.  No guarantees were given.  After consideration of risks, benefits and other options for treatment, the patient has consented to surgery.  I have reviewed the patients' chart and labs.    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  The patient was reexamined.  There have been no changes to this history and physical examination.  Rosetta Posner, DPM

## 2019-12-11 NOTE — Anesthesia Postprocedure Evaluation (Signed)
Anesthesia Post Note  Patient: Edward Richard  Procedure(s) Performed: INCISION AND DRAINAGE (Left Foot) AMPUTATION RAY 5TH RAY PARTIAL (Left Toe)  Patient location during evaluation: PACU Anesthesia Type: General Level of consciousness: awake and alert Pain management: pain level controlled Vital Signs Assessment: post-procedure vital signs reviewed and stable Respiratory status: spontaneous breathing, nonlabored ventilation and respiratory function stable Cardiovascular status: blood pressure returned to baseline and stable Postop Assessment: no apparent nausea or vomiting Anesthetic complications: no     Last Vitals:  Vitals:   12/11/19 2109 12/11/19 2157  BP: 104/70 134/67  Pulse: 74 76  Resp: 20 18  Temp: 36.8 C 36.6 C  SpO2: 98% 96%    Last Pain:  Vitals:   12/11/19 2157  TempSrc: Oral  PainSc:                  [name]

## 2019-12-11 NOTE — Plan of Care (Signed)
  Problem: Education: Goal: Knowledge of General Education information will improve Description: Including pain rating scale, medication(s)/side effects and non-pharmacologic comfort measures Outcome: Progressing   Problem: Clinical Measurements: Goal: Ability to maintain clinical measurements within normal limits will improve Outcome: Progressing Goal: Will remain free from infection Outcome: Progressing Goal: Diagnostic test results will improve Outcome: Progressing Goal: Respiratory complications will improve Outcome: Progressing Goal: Cardiovascular complication will be avoided Outcome: Progressing   Problem: Coping: Goal: Level of anxiety will decrease Outcome: Progressing   Problem: Pain Managment: Goal: General experience of comfort will improve Outcome: Progressing   Problem: Safety: Goal: Ability to remain free from injury will improve Outcome: Progressing   

## 2019-12-12 DIAGNOSIS — E11621 Type 2 diabetes mellitus with foot ulcer: Secondary | ICD-10-CM

## 2019-12-12 DIAGNOSIS — R7881 Bacteremia: Secondary | ICD-10-CM

## 2019-12-12 DIAGNOSIS — M86172 Other acute osteomyelitis, left ankle and foot: Secondary | ICD-10-CM

## 2019-12-12 DIAGNOSIS — E0865 Diabetes mellitus due to underlying condition with hyperglycemia: Secondary | ICD-10-CM

## 2019-12-12 DIAGNOSIS — L97528 Non-pressure chronic ulcer of other part of left foot with other specified severity: Secondary | ICD-10-CM

## 2019-12-12 DIAGNOSIS — B9562 Methicillin resistant Staphylococcus aureus infection as the cause of diseases classified elsewhere: Secondary | ICD-10-CM

## 2019-12-12 LAB — BLOOD CULTURE ID PANEL (REFLEXED)

## 2019-12-12 LAB — CBC
HCT: 29.4 % — ABNORMAL LOW (ref 39.0–52.0)
Hemoglobin: 9.7 g/dL — ABNORMAL LOW (ref 13.0–17.0)
MCH: 29.4 pg (ref 26.0–34.0)
MCHC: 33 g/dL (ref 30.0–36.0)
MCV: 89.1 fL (ref 80.0–100.0)
Platelets: 301 10*3/uL (ref 150–400)
RBC: 3.3 MIL/uL — ABNORMAL LOW (ref 4.22–5.81)
RDW: 12.2 % (ref 11.5–15.5)
WBC: 8.2 10*3/uL (ref 4.0–10.5)
nRBC: 0 % (ref 0.0–0.2)

## 2019-12-12 LAB — GLUCOSE, CAPILLARY
Glucose-Capillary: 257 mg/dL — ABNORMAL HIGH (ref 70–99)
Glucose-Capillary: 202 mg/dL — ABNORMAL HIGH (ref 70–99)
Glucose-Capillary: 87 mg/dL (ref 70–99)
Glucose-Capillary: 146 mg/dL — ABNORMAL HIGH (ref 70–99)

## 2019-12-12 LAB — MAGNESIUM: Magnesium: 2.3 mg/dL (ref 1.7–2.4)

## 2019-12-12 LAB — BASIC METABOLIC PANEL
Anion gap: 4 — ABNORMAL LOW (ref 5–15)
BUN: 19 mg/dL (ref 8–23)
CO2: 25 mmol/L (ref 22–32)
Calcium: 7.7 mg/dL — ABNORMAL LOW (ref 8.9–10.3)
Chloride: 107 mmol/L (ref 98–111)
Creatinine, Ser: 0.87 mg/dL (ref 0.61–1.24)
GFR calc Af Amer: >60 mL/min (ref >60)
GFR calc non Af Amer: >60 mL/min (ref >60)
Glucose, Bld: 318 mg/dL — ABNORMAL HIGH (ref 70–99)
Potassium: 4.5 mmol/L (ref 3.5–5.1)
Sodium: 136 mmol/L (ref 135–145)

## 2019-12-12 MED ORDER — INSULIN GLARGINE 100 UNIT/ML ~~LOC~~ SOLN: 15.0000 [IU] | Freq: Every day | SUBCUTANEOUS | Status: DC

## 2019-12-12 MED ORDER — INSULIN GLARGINE 100 UNIT/ML ~~LOC~~ SOLN
20.0000 [IU] | Freq: Every day | SUBCUTANEOUS | Status: DC
  Administered 2019-12-12 – 2019-12-15 (×4): 20 [IU] via SUBCUTANEOUS
  Filled 2019-12-12 (×5): qty 0.2

## 2019-12-12 MED ORDER — POLYETHYLENE GLYCOL 3350 17 G PO PACK
17.0000 g | PACK | Freq: Every day | ORAL | Status: DC
  Administered 2019-12-12 – 2019-12-14 (×3): 17 g via ORAL
  Filled 2019-12-12 (×4): qty 1

## 2019-12-12 MED ORDER — INSULIN ASPART 100 UNIT/ML ~~LOC~~ SOLN: 3.0000 [IU] | Freq: Three times a day (TID) | SUBCUTANEOUS | Status: DC

## 2019-12-12 MED ORDER — INSULIN ASPART 100 UNIT/ML ~~LOC~~ SOLN
4.0000 [IU] | Freq: Three times a day (TID) | SUBCUTANEOUS | Status: DC
  Administered 2019-12-12 – 2019-12-15 (×8): 4 [IU] via SUBCUTANEOUS
  Filled 2019-12-12 (×8): qty 1

## 2019-12-12 NOTE — TOC Progression Note (Signed)
Transition of Care [facility]) - Progression Note    Patient Details  Name: Edward Richard MRN: [id number] Date of Birth: Mar 27, 1956  Transition [facility]) CM/SW Contact  Barrie Dunker, RN Phone Number: 12/12/2019, 3:14 PM  Clinical Narrative:    [facility] and they are unable to accept any Medicaid patient due to the switch with Medicaid to HMO and problems with it,    Expected Discharge Plan: Home w Home Health Services Barriers to Discharge: Continued Medical Work up  Expected Discharge Plan and Services Expected Discharge Plan: Home w Home Health Services   Discharge Planning Services: CM Consult   Living arrangements for the past 2 months: Skilled Nursing Facility, Single Family Home                 DME Arranged: N/A         HH Arranged: PT, RN   Date HH Agency Contacted: 12/12/19 Time HH Agency Contacted: [phone] Representative spoke with at [facility]: [place]   Social Determinants of Health (SDOH) Interventions    Readmission Risk Interventions No flowsheet data found.

## 2019-12-12 NOTE — Progress Notes (Signed)
PODIATRY / FOOT AND ANKLE SURGERY PROGRESS NOTE  Requesting Physician: Eppie Gibson, MD  Reason for consult: L foot wound  Chief Complaint: Left foot wound   HPI: Arthur Ayden Hardwick is a 64 y.o. male who presents resting in bed comfortably status post left partial fifth ray amputation.  Patient states that he does have some pain to the area but states that it does not feel any different than what it did preoperatively.  He states that he has not put any weight on his foot since the procedure and has done well overnight with no reactions to anesthesia.  PMHx:  Past Medical History:  Diagnosis Date  . COPD (chronic obstructive pulmonary disease) ([place])   . Diabetes mellitus without complication ([place])   . Hypercholesteremia   . Hypertension   . Myocardial infarction [facility])     Surgical Hx:  Past Surgical History:  Procedure Laterality Date  . AMPUTATION Left 12/11/2019   Procedure: AMPUTATION RAY 5TH RAY PARTIAL;  Surgeon: Caroline More, DPM;  Location: [facility] ORS;  Service: Podiatry;  Laterality: Left;  . INCISION AND DRAINAGE Left 12/11/2019   Procedure: INCISION AND DRAINAGE;  Surgeon: Caroline More, DPM;  Location: [facility] ORS;  Service: Podiatry;  Laterality: Left;  . LOWER EXTREMITY ANGIOGRAPHY Left 12/11/2019   Procedure: Lower Extremity Angiography;  Surgeon: Katha Cabal, MD;  Location: [facility];  Service: Cardiovascular;  Laterality: Left;  . NO PAST SURGERIES      FHx:  Family History  Problem Relation Age of Onset  . Cancer Mother   . Heart failure Father   . Cancer Father     Social History:  reports that he has been smoking cigarettes. He has been smoking about 0.50 packs per day. He has never used smokeless tobacco. He reports current alcohol use. He reports that he does not use drugs.  Allergies: No Known Allergies  Review of Systems: General ROS: positive for  - fatigue Respiratory ROS: no cough, shortness of breath, or wheezing Cardiovascular ROS:  no chest pain or dyspnea on exertion Gastrointestinal ROS: no abdominal pain, change in bowel habits, or black or bloody stools Musculoskeletal ROS: positive for - joint pain, joint stiffness and joint swelling Neurological ROS: positive for - numbness/tingling Dermatological ROS: positive for Left partial fifth ray amputation site sutures intact  Medications Prior to Admission  Medication Sig Dispense Refill  . amLODipine (NORVASC) 10 MG tablet Take 10 mg by mouth daily.    . dapagliflozin propanediol (FARXIGA) 10 MG TABS tablet Take 10 mg by mouth daily.    . DULoxetine (CYMBALTA) 30 MG capsule Take 30 mg by mouth daily.    Marland Kitchen gabapentin (NEURONTIN) 400 MG capsule Take 800 mg by mouth 4 (four) times daily.     . insulin glargine (LANTUS) 100 UNIT/ML injection Inject 40 Units into the skin 2 (two) times daily.     . simvastatin (ZOCOR) 40 MG tablet Take 40 mg by mouth at bedtime.       Physical Exam: General: Alert and oriented.  No apparent distress.  Vascular: DP/PT pulses faintly palpable bilateral.  CFT appears to be intact to digits with only mild delayed to the fifth toe left.   Neuro: Light touch sensation absent to bilateral lower extremities  Derm: Left partial fifth ray amputation site with sutures intact, maceration to the skin flaps but capillary fill time appears to be intact at this time.  Associated erythema and edema present consistent with postoperative course and overall  decrease since admission.  No expressible purulence or odor.    MSK: Left foot pain on palpation.  Left partial fifth ray amputation  Results for orders placed or performed during the hospital encounter of 12/10/19 (from the past 48 hour(s))  Lactic acid, plasma     Status: None   Collection Time: 12/10/19  2:11 PM  Result Value Ref Range   Lactic Acid, Venous 1.0 0.5 - 1.9 mmol/L    Comment: Performed at [facility], [address] [place], [place] [address]  Sedimentation rate      Status: Abnormal   Collection Time: 12/10/19  3:42 PM  Result Value Ref Range   Sed Rate 95 (H) 0 - 20 mm/hr    Comment: Performed at [facility], [address] [place], [place] [address]  C-reactive protein     Status: Abnormal   Collection Time: 12/10/19  3:42 PM  Result Value Ref Range   CRP 10.8 (H) <1.0 mg/dL    Comment: Performed at [facility], [address] [place], [place] [address]  Hemoglobin A1c     Status: Abnormal   Collection Time: 12/10/19  4:26 PM  Result Value Ref Range   Hgb A1c MFr Bld 9.2 (H) 4.8 - 5.6 %    Comment: (NOTE) Pre diabetes:          5.7%-6.4% Diabetes:              >6.4% Glycemic control for   <7.0% adults with diabetes    Mean Plasma Glucose 217.34 mg/dL    Comment: Performed at [facility], [address], [place], [place] [address]  Urinalysis, Complete w Microscopic     Status: Abnormal   Collection Time: 12/10/19  7:01 PM  Result Value Ref Range   Color, Urine AMBER (A) YELLOW    Comment: BIOCHEMICALS MAY BE AFFECTED BY COLOR   APPearance HAZY (A) CLEAR   Specific Gravity, Urine 1.024 1.005 - 1.030   pH 5.0 5.0 - 8.0   Glucose, UA 150 (A) NEGATIVE mg/dL   Hgb urine dipstick SMALL (A) NEGATIVE   Bilirubin Urine NEGATIVE NEGATIVE   Ketones, ur NEGATIVE NEGATIVE mg/dL   Protein, ur 962 (A) NEGATIVE mg/dL   Nitrite NEGATIVE NEGATIVE   Leukocytes,Ua NEGATIVE NEGATIVE   RBC / HPF 11-20 0 - 5 RBC/hpf   WBC, UA 0-5 0 - 5 WBC/hpf   Bacteria, UA NONE SEEN NONE SEEN   Squamous Epithelial / LPF NONE SEEN 0 - 5   Mucus PRESENT     Comment: Performed at [facility], [address] [place], [place] [address]  Aerobic/Anaerobic Culture (surgical/deep wound)     Status: None (Preliminary result)   Collection Time: 12/10/19  7:26 PM   Specimen: Wound  Result Value Ref Range   Specimen Description      WOUND Performed at [facility], [address], [place], [place] [address]    Special Requests       NONE Performed at [facility], [address] [place], [place] [address]    Gram Stain      NO WBC SEEN ABUNDANT GRAM POSITIVE COCCI ABUNDANT GRAM NEGATIVE RODS    Culture      CULTURE REINCUBATED FOR BETTER GROWTH Performed at [facility], [address] [place], [place] [address]    Report Status PENDING   Glucose, capillary     Status: Abnormal   Collection Time: 12/10/19  9:40 PM  Result Value Ref Range   Glucose-Capillary 228 (H) 70 - 99 mg/dL    Comment: Glucose reference range applies only to samples taken after fasting for at least 8 hours.  SARS Coronavirus 2 by RT PCR (hospital order, performed in [facility] hospital lab) Nasopharyngeal Nasopharyngeal Swab     Status: None   Collection Time: 12/10/19 10:16 PM   Specimen: Nasopharyngeal Swab  Result Value Ref Range   SARS Coronavirus 2 NEGATIVE NEGATIVE    Comment: (NOTE) SARS-CoV-2 target nucleic acids are NOT DETECTED. The SARS-CoV-2 RNA is generally detectable in upper and lower respiratory specimens during the acute phase of infection. The lowest concentration of SARS-CoV-2 viral copies this assay can detect is 250 copies / mL. A negative result does not preclude SARS-CoV-2 infection and should not be used as the sole basis for treatment or other patient management decisions.  A negative result may occur with improper specimen collection / handling, submission of specimen other than nasopharyngeal swab, presence of viral mutation(s) within the areas targeted by this assay, and inadequate number of viral copies (<250 copies / mL). A negative result must be combined with clinical observations, patient history, and epidemiological information. Fact Sheet for Patients:   BoilerBrush.com.cy Fact Sheet for Healthcare Providers: https://pope.com/ This test is not yet approved or cleared  by the [place] FDA and has been authorized for  detection and/or diagnosis of SARS-CoV-2 by FDA under an Emergency Use Authorization (EUA).  This EUA will remain in effect (meaning this test can be used) for the duration of the COVID-19 declaration under Section 564(b)(1) of the Act, 21 U.S.C. section 360bbb-3(b)(1), unless the authorization is terminated or revoked sooner. Performed at [facility], [address] [place], [place] [address]   Surgical pcr screen     Status: None   Collection Time: 12/11/19 12:11 AM   Specimen: Nasal Mucosa; Nasal Swab  Result Value Ref Range   MRSA, PCR NEGATIVE NEGATIVE   Staphylococcus aureus NEGATIVE NEGATIVE    Comment: (NOTE) The Xpert SA Assay (FDA approved for NASAL specimens in patients 55 years of age and older), is one component of a comprehensive surveillance program. It is not intended to diagnose infection nor to guide or monitor treatment. Performed at [facility], [address] [place], [place] [address]   HIV Antibody (routine testing w rflx)     Status: None   Collection Time: 12/11/19  5:30 AM  Result Value Ref Range   HIV Screen 4th Generation wRfx Non Reactive Non Reactive    Comment: Performed at [facility], [address] [place], [place] [address]  CBC     Status: Abnormal   Collection Time: 12/11/19  5:30 AM  Result Value Ref Range   WBC 8.8 4.0 - 10.5 K/uL   RBC 3.41 (L) 4.22 - 5.81 MIL/uL   Hemoglobin 10.0 (L) 13.0 - 17.0 g/dL   HCT 91.4 (L) 78.2 - 95.6 %   MCV 89.7 80.0 - 100.0 fL   MCH 29.3 26.0 - 34.0 pg   MCHC 32.7 30.0 - 36.0 g/dL   RDW 21.3 08.6 - 57.8 %   Platelets 294 150 - 400 K/uL   nRBC 0.0 0.0 - 0.2 %    Comment: Performed at [facility], [address], [place], [place] [address]  Basic metabolic panel     Status: Abnormal   Collection Time: 12/11/19  5:30 AM  Result Value Ref Range  Sodium 134 (L) 135 - 145 mmol/L   Potassium 4.4 3.5 - 5.1 mmol/L   Chloride 103 98 - 111 mmol/L   CO2 25 22 - 32  mmol/L   Glucose, Bld 250 (H) 70 - 99 mg/dL    Comment: Glucose reference range applies only to samples taken after fasting for at least 8 hours.   BUN 20 8 - 23 mg/dL   Creatinine, Ser 6.29 0.61 - 1.24 mg/dL   Calcium 7.8 (L) 8.9 - 10.3 mg/dL   GFR calc non Af Amer >60 >60 mL/min   GFR calc Af Amer >60 >60 mL/min   Anion gap 6 5 - 15    Comment: Performed at [facility], [address] [place], [place] [address]  Glucose, capillary     Status: Abnormal   Collection Time: 12/11/19  8:20 AM  Result Value Ref Range   Glucose-Capillary 242 (H) 70 - 99 mg/dL    Comment: Glucose reference range applies only to samples taken after fasting for at least 8 hours.   Comment 1 Notify RN    Comment 2 Document in Chart   Glucose, capillary     Status: Abnormal   Collection Time: 12/11/19 11:41 AM  Result Value Ref Range   Glucose-Capillary 194 (H) 70 - 99 mg/dL    Comment: Glucose reference range applies only to samples taken after fasting for at least 8 hours.   Comment 1 Notify RN    Comment 2 Document in Chart   Glucose, capillary     Status: Abnormal   Collection Time: 12/11/19  3:37 PM  Result Value Ref Range   Glucose-Capillary 56 (L) 70 - 99 mg/dL    Comment: Glucose reference range applies only to samples taken after fasting for at least 8 hours.  Glucose, capillary     Status: None   Collection Time: 12/11/19  4:10 PM  Result Value Ref Range   Glucose-Capillary 88 70 - 99 mg/dL    Comment: Glucose reference range applies only to samples taken after fasting for at least 8 hours.  Glucose, capillary     Status: None   Collection Time: 12/11/19  4:56 PM  Result Value Ref Range   Glucose-Capillary 83 70 - 99 mg/dL    Comment: Glucose reference range applies only to samples taken after fasting for at least 8 hours.  Aerobic/Anaerobic Culture (surgical/deep wound)     Status: None (Preliminary result)   Collection Time: 12/11/19  5:37 PM   Specimen: PATH Digit amputation;  Tissue  Result Value Ref Range   Specimen Description      WOUND LEFT 5TH DIGIT Performed at [facility], [address] [place], [place] [address]    Special Requests      NONE Performed at [facility], [address] [place], [place] [address]    Gram Stain      FEW WBC PRESENT, PREDOMINANTLY PMN FEW GRAM POSITIVE COCCI RARE GRAM NEGATIVE RODS    Culture      CULTURE REINCUBATED FOR BETTER GROWTH Performed at [facility], [address], [place], [place] [address]    Report Status PENDING   Glucose, capillary     Status: None   Collection Time: 12/11/19  6:12 PM  Result Value Ref Range   Glucose-Capillary 89 70 - 99 mg/dL    Comment: Glucose reference range applies only to samples taken after fasting for at least 8 hours.  Glucose, capillary  Status: Abnormal   Collection Time: 12/11/19  8:01 PM  Result Value Ref Range   Glucose-Capillary 209 (H) 70 - 99 mg/dL    Comment: Glucose reference range applies only to samples taken after fasting for at least 8 hours.   Comment 1 Notify RN   CBC     Status: Abnormal   Collection Time: 12/12/19  5:50 AM  Result Value Ref Range   WBC 8.2 4.0 - 10.5 K/uL   RBC 3.30 (L) 4.22 - 5.81 MIL/uL   Hemoglobin 9.7 (L) 13.0 - 17.0 g/dL   HCT 16.129.4 (L) 09.639.0 - 04.552.0 %   MCV 89.1 80.0 - 100.0 fL   MCH 29.4 26.0 - 34.0 pg   MCHC 33.0 30.0 - 36.0 g/dL   RDW 40.912.2 81.111.5 - 91.415.5 %   Platelets 301 150 - 400 K/uL   nRBC 0.0 0.0 - 0.2 %    Comment: Performed at [facility], [address] [place], [place] [address]  Basic metabolic panel     Status: Abnormal   Collection Time: 12/12/19  5:50 AM  Result Value Ref Range   Sodium 136 135 - 145 mmol/L   Potassium 4.5 3.5 - 5.1 mmol/L   Chloride 107 98 - 111 mmol/L   CO2 25 22 - 32 mmol/L   Glucose, Bld 318 (H) 70 - 99 mg/dL    Comment: Glucose reference range applies only to samples taken after fasting for at least 8 hours.   BUN 19 8 - 23 mg/dL    Creatinine, Ser 6.210.87 0.61 - 1.24 mg/dL   Calcium 7.7 (L) 8.9 - 10.3 mg/dL   GFR calc non Af Amer >60 >60 mL/min   GFR calc Af Amer >60 >60 mL/min   Anion gap 4 (L) 5 - 15    Comment: Performed at [facility], [address] [place], [place] [address]  Magnesium     Status: None   Collection Time: 12/12/19  5:50 AM  Result Value Ref Range   Magnesium 2.3 1.7 - 2.4 mg/dL    Comment: Performed at [facility], [address] [place], [place] [address]  Glucose, capillary     Status: Abnormal   Collection Time: 12/12/19  8:00 AM  Result Value Ref Range   Glucose-Capillary 257 (H) 70 - 99 mg/dL    Comment: Glucose reference range applies only to samples taken after fasting for at least 8 hours.   Comment 1 Notify RN   Glucose, capillary     Status: Abnormal   Collection Time: 12/12/19 11:28 AM  Result Value Ref Range   Glucose-Capillary 202 (H) 70 - 99 mg/dL    Comment: Glucose reference range applies only to samples taken after fasting for at least 8 hours.   MR FOOT LEFT W WO CONTRAST  Result Date: 12/10/2019 CLINICAL DATA:  Wound around the fifth MTP joint. Evaluate for osteomyelitis. EXAM: MRI OF THE LEFT FOREFOOT WITHOUT AND WITH CONTRAST TECHNIQUE: Multiplanar, multisequence MR imaging of the left forefoot was performed both before and after administration of intravenous contrast. CONTRAST:  7mL GADAVIST GADOBUTROL 1 MMOL/ML IV SOLN COMPARISON:  Left foot x-rays from same day. FINDINGS: Bones/Joint/Cartilage Abnormal marrow edema and enhancement with corresponding decreased T1 marrow signal involving the fifth metatarsal head and base of the fifth proximal phalanx, consistent with osteomyelitis. No fracture or dislocation. Mild edema within the third and fourth metatarsal shafts preserved T1 marrow signal, possibly stress related. 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 Small second TMT joint effusion. Ligaments Collateral ligaments are  intact.  Lisfranc ligament is intact. Muscles and Tendons Flexor, peroneal and extensor compartment tendons are intact. Extensive fatty atrophy of the intrinsic foot muscles. Soft tissue Ulceration at the lateral aspect of the fifth metatarsal head extending to bone. Small area of surrounding devitalized soft tissue with foci of subcutaneous emphysema. Dorsal lateral forefoot skin thickening and enhancement. No fluid collection or hematoma. No soft tissue mass. IMPRESSION: 1. Ulceration at the lateral aspect of the fifth metatarsal head extending to bone with underlying osteomyelitis of the fifth metatarsal head and fifth proximal phalanx. 2. Small area of devitalized soft tissue adjacent to the ulceration. Cellulitis of the dorsal lateral forefoot. No abscess. Electronically Signed   By: Obie Dredge M.D.   On: 12/10/2019 21:37   PERIPHERAL VASCULAR CATHETERIZATION  Result Date: 12/11/2019 See Op Note  US Venous Img Lower Unilateral Left  Result Date: 12/10/2019 CLINICAL DATA:  Left lower extremity pain and swelling EXAM: LEFT LOWER EXTREMITY VENOUS DOPPLER ULTRASOUND TECHNIQUE: Gray-scale sonography with graded compression, as well as color Doppler and duplex ultrasound were performed to evaluate the lower extremity deep venous systems from the level of the common femoral vein and including the common femoral, femoral, profunda femoral, popliteal and calf veins including the posterior tibial, peroneal and gastrocnemius veins when visible. The superficial great saphenous vein was also interrogated. Spectral Doppler was utilized to evaluate flow at rest and with distal augmentation maneuvers in the common femoral, femoral and popliteal veins. COMPARISON:  None. FINDINGS: Contralateral Common Femoral Vein: Respiratory phasicity is normal and symmetric with the symptomatic side. No evidence of thrombus. Normal compressibility. Common Femoral Vein: No evidence of thrombus. Normal compressibility, respiratory  phasicity and response to augmentation. Saphenofemoral Junction: No evidence of thrombus. Normal compressibility and flow on color Doppler imaging. Profunda Femoral Vein: No evidence of thrombus. Normal compressibility and flow on color Doppler imaging. Femoral Vein: No evidence of thrombus. Normal compressibility, respiratory phasicity and response to augmentation. Popliteal Vein: No evidence of thrombus. Normal compressibility, respiratory phasicity and response to augmentation. Calf Veins: No evidence of thrombus. Normal compressibility and flow on color Doppler imaging. Superficial Great Saphenous Vein: No evidence of thrombus. Normal compressibility. Venous Reflux:  None. Other Findings:  None. IMPRESSION: No evidence of left lower extremity deep venous thrombosis. Electronically Signed   By: Duanne Guess D.O.   On: 12/10/2019 17:18   DG Foot Complete Left  Result Date: 12/11/2019 CLINICAL DATA:  Postop. EXAM: LEFT FOOT - COMPLETE 3+ VIEW COMPARISON:  Preoperative radiograph yesterday FINDINGS: Transmetatarsal amputation of the fifth ray. The remainder of the foot is intact. No evidence of osteomyelitis. Again seen plantar calcaneal spur and Achilles tendon enthesophyte. Well corticated density adjacent to the dorsal talus, favor sequela of remote prior injury. Vascular calcifications. IMPRESSION: Transmetatarsal amputation of the fifth ray. No immediate postoperative complication. Electronically Signed   By: Narda Rutherford M.D.   On: 12/11/2019 19:54    Blood pressure (!) 153/72, pulse 68, temperature 98.2 F (36.8 C), resp. rate 17, height 5\' 9"  (1.753 m), weight 83.4 kg, SpO2 98 %.  Assessment 1. 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 Diabetes type 2 with polyneuropathy 3. PVD 4. History of tobacco abuse 5. Chronic pain  Plan -Patient seen and examined. -Postop x-rays reviewed  and discussed with patient in detail. -Dressing removed and incision appears to  be well coapted with sutures intact but appears to be macerated at this time likely from drainage from both the infection vancomycin powder that was placed consistent with postoperative course.  Erythema and edema overall decrease since admission.  Patient doing much better now. -Redressed with Betadine soaked gauze to the incision line followed by 4 x 4 gauze, Kerlix, Ace wrap.  Patient instructed to leave dressings clean, dry, and intact until postoperative follow-up visit.  If patient is still in house on Friday will change dressing at that time. -PT consultation ordered.  Recommend partial weightbearing with heel contact in a surgical shoe.  Patient should try to stay off of the left foot is much as possible and use for transfers for the most part only and not stand or walk for prolonged periods of time as this caused the incision opened up further. -Discussed that the patient needs to work on smoking to decrease that in order to improve blood flow to the area as well as continue to work on controlling his blood sugars better to ensure proper wound/incision healing. -Cultures pending.  Gram-positive cocci and gram-negative rods.  Appreciate recommendations for antibiotics.  Depending on culture results believe that patient can likely go home on oral antibiotics based on sensitivities.  Will await path specimen as well but believe that we will be clear of infection based on intraoperative appearance. -We will continue to follow-up with patient until discharge.  Podiatry team to sign off and see more peripherally at this point.  If any questions or concerns please contact.  Follow-up instructions placed in chart.   Rosetta Posner, DPM 12/12/2019, 12:58 PM

## 2019-12-12 NOTE — Progress Notes (Addendum)
PROGRESS NOTE    Edward Richard  ZOX:[id number] DOB: 1955-10-08 DOA: 12/10/2019 PCP: Preston Fleetingevelo, Adrian Mancheno, MD   Chief Complaint  Patient presents with  . Wound Infection    Brief Narrative:  64 year old male with uncontrolled type 2 diabetes mellitus with peripheral neuropathy, hypertension, hyperlipidemia, ongoing tobacco use presented with  osteomyelitis and gas gangrene of the left fifth metatarsal head and fifth proximal phalanx and cellulitis of dorsal lateral forefoot. 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  Assessment & Plan: Principal problem Diabetic osteomyelitis of the left foot. 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  Recommend partial weightbearing with heel contact in a surgical shoe.  PT consult.  Complaining of a lot of pain and currently on as needed oxycodone and low-dose IV Dilaudid. Wound culture growing gram-positive cocci and gram-negative rods.  Continue empiric vancomycin and Unasyn.  Follow sensitivity. Vascular surgery consult appreciated.  Aortogram done without significant peripheral vascular disease.  Active Problems:   Diabetic ulcer of left foot associated with uncontrolled type 2 diabetes mellitus (HCC) Continue empiric antibiotics.  A1c of 9.2.  Increase Lantus to 20 units daily and add Premeal aspart 4 units 3 times daily.  Needs tight blood glucose control.  Continue gabapentin for neuropathy.  MRSA bacteremia Positive in 1/2 blood culture.  Continue empiric vancomycin and Unasyn.  Will follow sensitivity.  Source appears to be  his osteomyelitis.  Hyperlipidemia Continue statin.  Ongoing tobacco use Counseled on cessation.  Nicotine patch.  Essential hypertension Continue amlodipine and metoprolol  Postoperative acute blood loss anemia Hemoglobin dropped was 9.7 from baseline 13.  Will monitor for now.  DVT prophylaxis: Subcu Lovenox Code Status: Full code Family  Communication: None Disposition:   Status is: Inpatient  Remains inpatient appropriate because:Ongoing active pain requiring inpatient pain management.  IV antibiotics for bacteremia, pending final culture and sensitivity   Dispo: The patient is from: Home              Anticipated d/c is to: Home              Anticipated d/c date is: 3 days              Patient currently is not medically stable to d/c.       Consultants:   Podiatry, vascular surgery   Procedures:  Ray amputation left foot  Aortogram   Antimicrobials: IV vancomycin and Unasyn   Subjective: Seen and examined.  Complains of pain in his left foot.  Objective: Vitals:   12/11/19 2157 12/11/19 2305 12/12/19 0352 12/12/19 0759  BP: 134/67 (!) 141/66 (!) 153/73 (!) 153/72  Pulse: 76 72 74 68  Resp: 18 16 20 17   Temp: 97.9 F (36.6 C) 97.8 F (36.6 C) 98.2 F (36.8 C) 98.2 F (36.8 C)  TempSrc: Oral Oral Oral   SpO2: 96% 99% 97% 98%  Weight:      Height:        Intake/Output Summary (Last 24 hours) at 12/12/2019 1504 Last data filed at 12/12/2019 [id number] Gross per 24 hour  Intake 2705.84 ml  Output 1330 ml  Net 1375.84 ml   Filed Weights   12/10/19 1209 12/11/19 0000 12/11/19 1334  Weight: 79.4 kg 83.4 kg 83.4 kg    Examination:  General: Middle-aged male no acute distress, in some discomfort with pain HEENT: Moist mucosa, supple neck Chest: Clear bilaterally CVs: Normal S1-S2 GI: Soft, nondistended, nontender Musculoskeletal: Clean dressing  over the left foot    Data Reviewed: I have personally reviewed following labs and imaging studies  CBC: Recent Labs  Lab 12/10/19 1213 12/11/19 0530 12/12/19 0550  WBC 13.5* 8.8 8.2  NEUTROABS 10.9*  --   --   HGB 11.5* 10.0* 9.7*  HCT 34.4* 30.6* 29.4*  MCV 88.2 89.7 89.1  PLT 357 294 301    Basic Metabolic Panel: Recent Labs  Lab 12/10/19 1213 12/11/19 0530 12/12/19 0550  NA 134* 134* 136  K 3.9 4.4 4.5  CL 101 103 107  CO2 22  25 25   GLUCOSE 217* 250* 318*  BUN 17 20 19   CREATININE 1.08 1.07 0.87  CALCIUM 8.5* 7.8* 7.7*  MG  --   --  2.3    GFR: Estimated Creatinine Clearance: 86.9 mL/min (by C-G formula based on SCr of 0.87 mg/dL).  Liver Function Tests: Recent Labs  Lab 12/10/19 1213  AST 15  ALT 9  ALKPHOS 70  BILITOT 0.9  PROT 8.6*  ALBUMIN 3.5    CBG: Recent Labs  Lab 12/11/19 1656 12/11/19 1812 12/11/19 2001 12/12/19 0800 12/12/19 1128  GLUCAP 83 89 209* 257* 202*     Recent Results (from the past 240 hour(s))  Culture, blood (Routine x 2)     Status: Abnormal (Preliminary result)   Collection Time: 12/10/19 12:13 PM   Specimen: BLOOD  Result Value Ref Range Status   Specimen Description   Final    BLOOD RIGHT ANTECUBITAL Performed at [facility], [address], [place], [address] [place]    Special Requests   Final    BOTTLES DRAWN AEROBIC AND ANAEROBIC Blood Culture results may not be optimal due to an excessive volume of blood received in culture bottles Performed at [facility], [address], [place], [address] [place]    Culture  Setup Time   Final    Organism ID to follow GRAM POSITIVE COCCI ANAEROBIC BOTTLE ONLY CRITICAL RESULT CALLED TO, READ BACK BY AND VERIFIED WITH: JASON ROBBINS 12/11/19 1647 KLW Performed at [facility], [address], [place], [address] [place]    Culture (A)  Final    STAPHYLOCOCCUS AUREUS SUSCEPTIBILITIES TO FOLLOW Performed at [facility], [address], [place], [address] [place]    Report Status PENDING  Incomplete  Blood Culture ID Panel (Reflexed)     Status: Abnormal   Collection Time: 12/10/19 12:13 PM  Result Value Ref Range Status   Enterococcus species NOT DETECTED NOT DETECTED Final   Listeria monocytogenes NOT DETECTED NOT DETECTED Final   Staphylococcus species DETECTED (A) NOT DETECTED Corrected    Comment: CRITICAL RESULT CALLED TO, READ BACK BY AND VERIFIED WITH: JASON  ROBBINS 12/11/19 1647 KLW CORRECTED ON 06/09 AT 0853: PREVIOUSLY REPORTED AS DETECTED CRITICAL RESULT CALLED TO, READ BACK BY AND VERIFIED WITH: JASON ROBBINS 12/11/19 KLW    Staphylococcus aureus (BCID) DETECTED (A) NOT DETECTED Corrected    Comment: Methicillin (oxacillin)-resistant Staphylococcus aureus (MRSA). MRSA is predictably resistant to beta-lactam antibiotics (except ceftaroline). Preferred therapy is vancomycin unless clinically contraindicated. Patient requires contact precautions if  hospitalized. CRITICAL RESULT CALLED TO, READ BACK BY AND VERIFIED WITH: JASON ROBBINS 12/11/19 1647 KLW CORRECTED ON 06/09 AT 0853: PREVIOUSLY REPORTED AS DETECTED Methicillin (oxacillin) resistant Staphylococcus aureus (MRSA). MRSA is predictably resistant to beta lactam antibiotics (except ceftaroline). Preferred therapy is vancomycin unless clinically  contraindicated. Patient requires contact precautions if hospitalized. CRITICAL RESULT CALLED TO, READ BACK BY AND  VERIFIED WITH: JASON ROBBINS 12/11/19 KLW    Methicillin resistance DETECTED (A) NOT DETECTED Final    Comment: CRITICAL RESULT CALLED TO, READ BACK BY AND VERIFIED WITH: JASON ROBBINS 12/11/19 1647 KLW    Streptococcus species NOT DETECTED NOT DETECTED Final   Streptococcus agalactiae NOT DETECTED NOT DETECTED Final   Streptococcus pneumoniae NOT DETECTED NOT DETECTED Final   Streptococcus pyogenes NOT DETECTED NOT DETECTED Final   Acinetobacter baumannii NOT DETECTED NOT DETECTED Final   Enterobacteriaceae species NOT DETECTED NOT DETECTED Final   Enterobacter cloacae complex NOT DETECTED NOT DETECTED Final   Escherichia coli NOT DETECTED NOT DETECTED Final   Klebsiella oxytoca NOT DETECTED NOT DETECTED Final   Klebsiella pneumoniae NOT DETECTED NOT DETECTED Final   Proteus species NOT DETECTED NOT DETECTED Final   Serratia marcescens NOT DETECTED NOT DETECTED Final   Haemophilus influenzae NOT DETECTED NOT DETECTED Final   Neisseria  meningitidis NOT DETECTED NOT DETECTED Final   Pseudomonas aeruginosa NOT DETECTED NOT DETECTED Final   Candida albicans NOT DETECTED NOT DETECTED Final   Candida glabrata NOT DETECTED NOT DETECTED Final   Candida krusei NOT DETECTED NOT DETECTED Final   Candida parapsilosis NOT DETECTED NOT DETECTED Final   Candida tropicalis NOT DETECTED NOT DETECTED Final    Comment: Performed at [facility], [address] [place], [place] [address]  Aerobic/Anaerobic Culture (surgical/deep wound)     Status: None (Preliminary result)   Collection Time: 12/10/19  7:26 PM   Specimen: Wound  Result Value Ref Range Status   Specimen Description   Final    WOUND Performed at [facility], [address] [place], [place] [address]    Special Requests   Final    NONE Performed at [facility], [address] [place], [place] [address]    Gram Stain   Final    NO WBC SEEN ABUNDANT GRAM POSITIVE COCCI ABUNDANT GRAM NEGATIVE RODS    Culture   Final    CULTURE REINCUBATED FOR BETTER GROWTH Performed at [facility], [address] [place], [place] [address]    Report Status PENDING  Incomplete  SARS Coronavirus 2 by RT PCR (hospital order, performed in [facility] hospital lab) Nasopharyngeal Nasopharyngeal Swab     Status: None   Collection Time: 12/10/19 10:16 PM   Specimen: Nasopharyngeal Swab  Result Value Ref Range Status   SARS Coronavirus 2 NEGATIVE NEGATIVE Final    Comment: (NOTE) SARS-CoV-2 target nucleic acids are NOT DETECTED. The SARS-CoV-2 RNA is generally detectable in upper and lower respiratory specimens during the acute phase of infection. The lowest concentration of SARS-CoV-2 viral copies this assay can detect is 250 copies / mL. A negative result does not preclude SARS-CoV-2 infection and should not be used as the sole basis for treatment or other patient management decisions.  A negative result may occur with improper specimen  collection / handling, submission of specimen other than nasopharyngeal swab, presence of viral mutation(s) within the areas targeted by this assay, and inadequate number of viral copies (<250 copies / mL). A negative result must be combined with clinical observations, patient history, and epidemiological information. Fact Sheet for Patients:   BoilerBrush.com.cy Fact Sheet for Healthcare Providers: https://pope.com/ This test is not yet approved or cleared  by the [place] FDA and has been authorized for detection and/or diagnosis of SARS-CoV-2 by FDA under an Emergency Use Authorization (EUA).  This EUA will remain in effect (meaning this test  can be used) for the duration of the COVID-19 declaration under Section 564(b)(1) of the Act, 21 U.S.C. section 360bbb-3(b)(1), unless the authorization is terminated or revoked sooner. Performed at [facility], [address], [place], [place] [address]   Surgical pcr screen     Status: None   Collection Time: 12/11/19 12:11 AM   Specimen: Nasal Mucosa; Nasal Swab  Result Value Ref Range Status   MRSA, PCR NEGATIVE NEGATIVE Final   Staphylococcus aureus NEGATIVE NEGATIVE Final    Comment: (NOTE) The Xpert SA Assay (FDA approved for NASAL specimens in patients 3 years of age and older), is one component of a comprehensive surveillance program. It is not intended to diagnose infection nor to guide or monitor treatment. Performed at [facility], [address] [place], [place] [address]   Aerobic/Anaerobic Culture (surgical/deep wound)     Status: None (Preliminary result)   Collection Time: 12/11/19  5:37 PM   Specimen: PATH Digit amputation; Tissue  Result Value Ref Range Status   Specimen Description   Final    WOUND LEFT 5TH DIGIT Performed at [facility], [address], [place], [place] [address]    Special Requests   Final    NONE Performed  at [facility], [address] [place], [place] [address]    Gram Stain   Final    FEW WBC PRESENT, PREDOMINANTLY PMN FEW GRAM POSITIVE COCCI RARE GRAM NEGATIVE RODS    Culture   Final    CULTURE REINCUBATED FOR BETTER GROWTH Performed at [facility], [address], [place], [place] [address]    Report Status PENDING  Incomplete         Radiology Studies: MR FOOT LEFT W WO CONTRAST  Result Date: 12/10/2019 CLINICAL DATA:  Wound around the fifth MTP joint. Evaluate for osteomyelitis. EXAM: MRI OF THE LEFT FOREFOOT WITHOUT AND WITH CONTRAST TECHNIQUE: Multiplanar, multisequence MR imaging of the left forefoot was performed both before and after administration of intravenous contrast. CONTRAST:  95mL GADAVIST GADOBUTROL 1 MMOL/ML IV SOLN COMPARISON:  Left foot x-rays from same day. FINDINGS: Bones/Joint/Cartilage Abnormal marrow edema and enhancement with corresponding decreased T1 marrow signal involving the fifth metatarsal head and base of the fifth proximal phalanx, consistent with osteomyelitis. No fracture or dislocation. Mild edema within the third and fourth metatarsal shafts preserved T1 marrow signal, possibly stress related. 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 Small second TMT joint effusion. Ligaments Collateral ligaments are intact.  Lisfranc ligament is intact. Muscles and Tendons Flexor, peroneal and extensor compartment tendons are intact. Extensive fatty atrophy of the intrinsic foot muscles. Soft tissue Ulceration at the lateral aspect of the fifth metatarsal head extending to bone. Small area of surrounding devitalized soft tissue with foci of subcutaneous emphysema. Dorsal lateral forefoot skin thickening and enhancement. No fluid collection or hematoma. No soft tissue mass. IMPRESSION: 1. Ulceration at the lateral aspect of the fifth metatarsal head extending to bone with underlying osteomyelitis of the fifth metatarsal  head and fifth proximal phalanx. 2. Small area of devitalized soft tissue adjacent to the ulceration. Cellulitis of the dorsal lateral forefoot. No abscess. Electronically Signed   By: Obie Dredge M.D.   On: 12/10/2019 21:37   PERIPHERAL VASCULAR CATHETERIZATION  Result Date: 12/11/2019 See Op Note  US Venous Img Lower Unilateral Left  Result Date: 12/10/2019 CLINICAL DATA:  Left lower extremity pain and swelling EXAM: LEFT LOWER EXTREMITY VENOUS DOPPLER ULTRASOUND TECHNIQUE:  Gray-scale sonography with graded compression, as well as color Doppler and duplex ultrasound were performed to evaluate the lower extremity deep venous systems from the level of the common femoral vein and including the common femoral, femoral, profunda femoral, popliteal and calf veins including the posterior tibial, peroneal and gastrocnemius veins when visible. The superficial great saphenous vein was also interrogated. Spectral Doppler was utilized to evaluate flow at rest and with distal augmentation maneuvers in the common femoral, femoral and popliteal veins. COMPARISON:  None. FINDINGS: Contralateral Common Femoral Vein: Respiratory phasicity is normal and symmetric with the symptomatic side. No evidence of thrombus. Normal compressibility. Common Femoral Vein: No evidence of thrombus. Normal compressibility, respiratory phasicity and response to augmentation. Saphenofemoral Junction: No evidence of thrombus. Normal compressibility and flow on color Doppler imaging. Profunda Femoral Vein: No evidence of thrombus. Normal compressibility and flow on color Doppler imaging. Femoral Vein: No evidence of thrombus. Normal compressibility, respiratory phasicity and response to augmentation. Popliteal Vein: No evidence of thrombus. Normal compressibility, respiratory phasicity and response to augmentation. Calf Veins: No evidence of thrombus. Normal compressibility and flow on color Doppler imaging. Superficial Great Saphenous Vein:  No evidence of thrombus. Normal compressibility. Venous Reflux:  None. Other Findings:  None. IMPRESSION: No evidence of left lower extremity deep venous thrombosis. Electronically Signed   By: Davina Poke D.O.   On: 12/10/2019 17:18   DG Foot Complete Left  Result Date: 12/11/2019 CLINICAL DATA:  Postop. EXAM: LEFT FOOT - COMPLETE 3+ VIEW COMPARISON:  Preoperative radiograph yesterday FINDINGS: Transmetatarsal amputation of the fifth ray. The remainder of the foot is intact. No evidence of osteomyelitis. Again seen plantar calcaneal spur and Achilles tendon enthesophyte. Well corticated density adjacent to the dorsal talus, favor sequela of remote prior injury. Vascular calcifications. IMPRESSION: Transmetatarsal amputation of the fifth ray. No immediate postoperative complication. Electronically Signed   By: Keith Rake M.D.   On: 12/11/2019 19:54        Scheduled Meds: . amLODipine  10 mg Oral Daily  . atorvastatin  20 mg Oral QPM  . DULoxetine  30 mg Oral Daily  . enoxaparin (LOVENOX) injection  40 mg Subcutaneous Q24H  . gabapentin  800 mg Oral TID  . insulin aspart  0-15 Units Subcutaneous TID WC  . insulin aspart  4 Units Subcutaneous TID WC  . insulin glargine  20 Units Subcutaneous Daily  . metoprolol tartrate  25 mg Oral BID  . nicotine  14 mg Transdermal Daily  . polyethylene glycol  17 g Oral Daily  . sodium chloride flush  3 mL Intravenous Q12H  . sodium chloride flush  3 mL Intravenous Q12H   Continuous Infusions: . sodium chloride    . ampicillin-sulbactam (UNASYN) IV 3 g (12/12/19 1258)  . vancomycin 1,000 mg (12/12/19 1000)     LOS: 2 days    Time spent: 35 minutes    Larina Lieurance, MD [facility]   To contact the attending provider between 7A-7P or the covering provider during after hours 7P-7A, please log into the web site [email or web address] and access using universal [place] password for that web site. If you do not have the password,  please call the hospital operator.  12/12/2019, 3:04 PM

## 2019-12-12 NOTE — TOC Progression Note (Signed)
Transition of Care [facility]) - Progression Note    Patient Details  Name: Edward Richard MRN: [id number] Date of Birth: 02-09-1956  Transition of Care [facility]) CM/SW Contact  Barrie Dunker, RN Phone Number: 12/12/2019, 3:15 PM  Clinical Narrative:    Mariea Clonts With [facility] and they will not be able to accept a Medicaid patient due to the switch with HMO, medicaid is only approved 3 visits per year.    Expected Discharge Plan: Home w Home Health Services Barriers to Discharge: Continued Medical Work up  Expected Discharge Plan and Services Expected Discharge Plan: Home w Home Health Services   Discharge Planning Services: CM Consult   Living arrangements for the past 2 months: Skilled Nursing Facility, Single Family Home                 DME Arranged: N/A         HH Arranged: PT, RN   Date HH Agency Contacted: 12/12/19 Time HH Agency Contacted: 1456 Representative spoke with at [facility] Agency: [place]   Social Determinants of Health (SDOH) Interventions    Readmission Risk Interventions No flowsheet data found.

## 2019-12-12 NOTE — TOC Initial Note (Signed)
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 and wants to get back home, his sister is now caring for their mother and will also care for him atg home, he has DME at home and does not need additional, He refuses to go to SNF due to being Medicaid and having to sign over his check, he stated there is no way he is doing that He is aware that he will be very limited at home and will need a lot of care and help and stated that his sister will be doing it, I explained that due to having medicaid it will be difficult to find Home health agency to accept him, he stated understanding, [facility] [place] [facility] and [facility] them to accept the patient, he stated that due to Medicaid changing over to HMO there is a problem with the switch and until that is resolved they will not be able to accept any medicaid patient, will continue to search for a home health agency Expected Discharge Plan: Home w Home Health Services Barriers to Discharge: Continued Medical Work up   Patient Goals and CMS Choice Patient states their goals for this hospitalization and ongoing recovery are:: got to get home to help his mom      Expected Discharge Plan and Services Expected Discharge Plan: Home w Home Health Services   Discharge Planning Services: CM Consult   Living arrangements for the past 2 months: Skilled Nursing Facility, Single Family Home                 DME Arranged: N/A         HH Arranged: PT, RN   Date HH Agency Contacted: 12/12/19 Time HH Agency Contacted: 1456 Representative spoke with at [facility] Agency: [place]  Prior Living Arrangements/Services Living  arrangements for the past 2 months: Skilled Nursing Facility, Single Family Home Lives with:: Parents Patient language and need for interpreter reviewed:: Yes Do you feel safe going back to the place where you live?: Yes      Need for Family Participation in Patient Care: No (Comment) Care giver support system in place?: Yes (comment) Current home services: DME(wheelchair, rolling walker, bed side commode, cane, shower seat) Criminal Activity/Legal Involvement Pertinent to Current Situation/Hospitalization: No - Comment as needed  Activities of Daily Living Home Assistive Devices/Equipment: Dentures (specify type), Cane (specify quad or straight) ADL Screening (condition at time of admission) Patient's cognitive ability adequate to safely complete daily activities?: Yes Is the patient deaf or have difficulty hearing?: No Does the patient have difficulty seeing, even when wearing glasses/contacts?: No Does the patient have difficulty concentrating, remembering, or making decisions?: No Patient able to express need for assistance with ADLs?: Yes Does the patient have difficulty dressing or bathing?: No Independently performs ADLs?: Yes (appropriate for developmental age) Does the patient have difficulty walking or climbing stairs?: Yes Weakness of Legs: Left Weakness of Arms/Hands: None  Permission Sought/Granted   Permission granted to share information with : Yes, Verbal Permission Granted  Emotional Assessment Appearance:: Appears stated age Attitude/Demeanor/Rapport: Engaged Affect (typically observed): Appropriate Orientation: : Oriented to Self, Oriented to Place, Oriented to  Time, Oriented to Situation Alcohol / Substance Use: Not Applicable, Alcohol Use, Tobacco Use Psych Involvement: No (comment)  Admission diagnosis:  Cellulitis of left lower extremity [L03.116] Diabetic ulcer of left foot associated with type 2 diabetes mellitus, with other ulcer  severity, unspecified part of foot ([place]) [U23.536, L97.528] Sepsis, due to unspecified organism, unspecified whether acute organ dysfunction present [facility]) [A41.9] Patient Active Problem List   Diagnosis Date Noted  . Diabetic ulcer of left foot associated with type 2 diabetes mellitus ([place]) 12/10/2019  . Chronic hand pain (Location of Primary Source of Pain) (Bilateral) (R>L) 11/16/2016  . Chronic foot pain (Location of Secondary source of pain) (Bilateral) (R>L) 11/16/2016  . Chronic feet pain (Location of Secondary source of pain) (Bilateral) (R>L) 11/16/2016  . Long term prescription opiate use 11/16/2016  . Opiate use 11/16/2016  . Neuropathic pain 11/16/2016  . Chronic low back pain (Location of Tertiary source of pain) (Bilateral) (R>L) 11/16/2016  . Long term current use of anticoagulant therapy (Plavix) 11/16/2016  . Cervical DDD (from C4-5 to C6-7) 11/16/2016  . Chronic pain syndrome 11/15/2016  . Chronic hepatitis C without hepatic coma ([place]) 09/22/2015  . Diabetic peripheral neuropathy ([place]) 09/22/2015  . Tobacco abuse 09/22/2015   PCP:  Theotis Burrow, MD Pharmacy:   [place], [place] [address] [place] [address] Phone: [phone] Fax: [phone]     Social Determinants of Health (SDOH) Interventions    Readmission Risk Interventions No flowsheet data found.

## 2019-12-12 NOTE — Consult Note (Signed)
[facility] Cardiology Consultation Note  Patient ID: Edward Richard, MRN: [id number], DOB/AGE: 1955/12/31 64 y.o. Admit date: 12/10/2019   Date of Consult: 12/12/2019 Primary Physician: Preston Fleeting, MD Primary Cardiologist: None  Chief Complaint:  Chief Complaint  Patient presents with  . Wound Infection   Reason for Consult: Possible endocarditis with MRSA bacteremia  HPI: 64 y.o. male with known diabetes hypertension hyperlipidemia on appropriate medication management who is at the significant issues with peripheral vascular disease and osteomyelitis.  The patient has had a recent toe amputation and osteomyelitis of his right foot.  The patient also has had a bacteremia with MRSA bacteremia concerning for possible other source of infection.  Other concerns is the patient may have endocarditis.  Currently the patient is not having any fevers chills or other significant symptoms at this time.  The patient does have appropriate medication management for his COPD diabetes hypertension hyperlipidemia.  Currently there is no evidence of chest pain and/or congestive heart failure.  Past Medical History:  Diagnosis Date  . COPD (chronic obstructive pulmonary disease) (HCC)   . Diabetes mellitus without complication (HCC)   . Hypercholesteremia   . Hypertension   . Myocardial infarction [facility])       Surgical History:  Past Surgical History:  Procedure Laterality Date  . AMPUTATION Left 12/11/2019   Procedure: AMPUTATION RAY 5TH RAY PARTIAL;  Surgeon: Rosetta Posner, DPM;  Location: [facility] ORS;  Service: Podiatry;  Laterality: Left;  . INCISION AND DRAINAGE Left 12/11/2019   Procedure: INCISION AND DRAINAGE;  Surgeon: Rosetta Posner, DPM;  Location: [facility] ORS;  Service: Podiatry;  Laterality: Left;  . LOWER EXTREMITY ANGIOGRAPHY Left 12/11/2019   Procedure: Lower Extremity Angiography;  Surgeon: Renford Dills, MD;  Location: [facility] INVASIVE CV LAB;  Service: Cardiovascular;   Laterality: Left;  . NO PAST SURGERIES       Home Meds: Prior to Admission medications   Medication Sig Start Date End Date Taking? Authorizing Provider  amLODipine (NORVASC) 10 MG tablet Take 10 mg by mouth daily.   Yes [provider]  dapagliflozin propanediol (FARXIGA) 10 MG TABS tablet Take 10 mg by mouth daily.   Yes [provider]  DULoxetine (CYMBALTA) 30 MG capsule Take 30 mg by mouth daily.   Yes [provider]  gabapentin (NEURONTIN) 400 MG capsule Take 800 mg by mouth 4 (four) times daily.    Yes [provider]  insulin glargine (LANTUS) 100 UNIT/ML injection Inject 40 Units into the skin 2 (two) times daily.    Yes [provider]  simvastatin (ZOCOR) 40 MG tablet Take 40 mg by mouth at bedtime.    Yes [provider]    Inpatient Medications:  . amLODipine  10 mg Oral Daily  . atorvastatin  20 mg Oral QPM  . DULoxetine  30 mg Oral Daily  . enoxaparin (LOVENOX) injection  40 mg Subcutaneous Q24H  . gabapentin  800 mg Oral TID  . insulin aspart  0-15 Units Subcutaneous TID WC  . insulin aspart  4 Units Subcutaneous TID WC  . insulin glargine  20 Units Subcutaneous Daily  . metoprolol tartrate  25 mg Oral BID  . nicotine  14 mg Transdermal Daily  . polyethylene glycol  17 g Oral Daily  . sodium chloride flush  3 mL Intravenous Q12H  . sodium chloride flush  3 mL Intravenous Q12H   . sodium chloride    . ampicillin-sulbactam (UNASYN) IV 3 g (12/12/19  1258)  . vancomycin 1,000 mg (12/12/19 1000)    Allergies: No Known Allergies  Social History   Socioeconomic History  . Marital status: Legally Separated    Spouse name: Not on file  . Number of children: Not on file  . Years of education: Not on file  . Highest education level: Not on file  Occupational History  . Not on file  Tobacco Use  . Smoking status: Current Every Day Smoker    Packs/day: 0.50    Types: Cigarettes  . Smokeless tobacco: Never  Used  Substance and Sexual Activity  . Alcohol use: Yes  . Drug use: No  . Sexual activity: Yes  Other Topics Concern  . Not on file  Social History Narrative  . Not on file   Social Determinants of Health   Financial Resource Strain:   . Difficulty of Paying Living Expenses:   Food Insecurity:   . Worried About Programme researcher, broadcasting/film/video in the Last Year:   . Barista in the Last Year:   Transportation Needs:   . Freight forwarder (Medical):   Marland Kitchen Lack of Transportation (Non-Medical):   Physical Activity:   . Days of Exercise per Week:   . Minutes of Exercise per Session:   Stress:   . Feeling of Stress :   Social Connections:   . Frequency of Communication with Friends and Family:   . Frequency of Social Gatherings with Friends and Family:   . Attends Religious Services:   . Active Member of Clubs or Organizations:   . Attends Banker Meetings:   Marland Kitchen Marital Status:   Intimate Partner Violence:   . Fear of Current or Ex-Partner:   . Emotionally Abused:   Marland Kitchen Physically Abused:   . Sexually Abused:      Family History  Problem Relation Age of Onset  . Cancer Mother   . Heart failure Father   . Cancer Father      Review of Systems Positive for foot pain Negative for: General:  chills, fever, night sweats or weight changes.  Cardiovascular: PND orthopnea syncope dizziness  Dermatological skin lesions rashes Respiratory: Cough congestion Urologic: Frequent urination urination at night and hematuria Abdominal: negative for nausea, vomiting, diarrhea, bright red blood per rectum, melena, or hematemesis Neurologic: negative for visual changes, and/or hearing changes  All other systems reviewed and are otherwise negative except as noted above.  Labs: No results for input(s): CKTOTAL, CKMB, TROPONINI in the last 72 hours. Lab Results  Component Value Date   WBC 8.2 12/12/2019   HGB 9.7 (L) 12/12/2019   HCT 29.4 (L) 12/12/2019   MCV 89.1 12/12/2019    PLT 301 12/12/2019    Recent Labs  Lab 12/10/19 1213 12/11/19 0530 12/12/19 0550  NA 134*   < > 136  K 3.9   < > 4.5  CL 101   < > 107  CO2 22   < > 25  BUN 17   < > 19  CREATININE 1.08   < > 0.87  CALCIUM 8.5*   < > 7.7*  PROT 8.6*  --   --   BILITOT 0.9  --   --   ALKPHOS 70  --   --   ALT 9  --   --   AST 15  --   --   GLUCOSE 217*   < > 318*   < > = values in this interval not displayed.  No results found for: CHOL, HDL, LDLCALC, TRIG No results found for: DDIMER  Radiology/Studies:  MR FOOT LEFT W WO CONTRAST  Result Date: 12/10/2019 CLINICAL DATA:  Wound around the fifth MTP joint. Evaluate for osteomyelitis. EXAM: MRI OF THE LEFT FOREFOOT WITHOUT AND WITH CONTRAST TECHNIQUE: Multiplanar, multisequence MR imaging of the left forefoot was performed both before and after administration of intravenous contrast. CONTRAST:  55mL GADAVIST GADOBUTROL 1 MMOL/ML IV SOLN COMPARISON:  Left foot x-rays from same day. FINDINGS: Bones/Joint/Cartilage Abnormal marrow edema and enhancement with corresponding decreased T1 marrow signal involving the fifth metatarsal head and base of the fifth proximal phalanx, consistent with osteomyelitis. No fracture or dislocation. Mild edema within the third and fourth metatarsal shafts preserved T1 marrow signal, possibly stress related. 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 Small second TMT joint effusion. Ligaments Collateral ligaments are intact.  Lisfranc ligament is intact. Muscles and Tendons Flexor, peroneal and extensor compartment tendons are intact. Extensive fatty atrophy of the intrinsic foot muscles. Soft tissue Ulceration at the lateral aspect of the fifth metatarsal head extending to bone. Small area of surrounding devitalized soft tissue with foci of subcutaneous emphysema. Dorsal lateral forefoot skin thickening and enhancement. No fluid collection or hematoma. No soft tissue mass. IMPRESSION: 1. Ulceration  at the lateral aspect of the fifth metatarsal head extending to bone with underlying osteomyelitis of the fifth metatarsal head and fifth proximal phalanx. 2. Small area of devitalized soft tissue adjacent to the ulceration. Cellulitis of the dorsal lateral forefoot. No abscess. Electronically Signed   By: Titus Dubin M.D.   On: 12/10/2019 21:37   PERIPHERAL VASCULAR CATHETERIZATION  Result Date: 12/11/2019 See Op Note  US Venous Img Lower Unilateral Left  Result Date: 12/10/2019 CLINICAL DATA:  Left lower extremity pain and swelling EXAM: LEFT LOWER EXTREMITY VENOUS DOPPLER ULTRASOUND TECHNIQUE: Gray-scale sonography with graded compression, as well as color Doppler and duplex ultrasound were performed to evaluate the lower extremity deep venous systems from the level of the common femoral vein and including the common femoral, femoral, profunda femoral, popliteal and calf veins including the posterior tibial, peroneal and gastrocnemius veins when visible. The superficial great saphenous vein was also interrogated. Spectral Doppler was utilized to evaluate flow at rest and with distal augmentation maneuvers in the common femoral, femoral and popliteal veins. COMPARISON:  None. FINDINGS: Contralateral Common Femoral Vein: Respiratory phasicity is normal and symmetric with the symptomatic side. No evidence of thrombus. Normal compressibility. Common Femoral Vein: No evidence of thrombus. Normal compressibility, respiratory phasicity and response to augmentation. Saphenofemoral Junction: No evidence of thrombus. Normal compressibility and flow on color Doppler imaging. Profunda Femoral Vein: No evidence of thrombus. Normal compressibility and flow on color Doppler imaging. Femoral Vein: No evidence of thrombus. Normal compressibility, respiratory phasicity and response to augmentation. Popliteal Vein: No evidence of thrombus. Normal compressibility, respiratory phasicity and response to augmentation. Calf  Veins: No evidence of thrombus. Normal compressibility and flow on color Doppler imaging. Superficial Great Saphenous Vein: No evidence of thrombus. Normal compressibility. Venous Reflux:  None. Other Findings:  None. IMPRESSION: No evidence of left lower extremity deep venous thrombosis. Electronically Signed   By: Davina Poke D.O.   On: 12/10/2019 17:18   DG Foot Complete Left  Result Date: 12/11/2019 CLINICAL DATA:  Postop. EXAM: LEFT FOOT - COMPLETE 3+ VIEW COMPARISON:  Preoperative radiograph yesterday FINDINGS: Transmetatarsal amputation of the fifth ray. The remainder of the foot is intact. No evidence of osteomyelitis.  Again seen plantar calcaneal spur and Achilles tendon enthesophyte. Well corticated density adjacent to the dorsal talus, favor sequela of remote prior injury. Vascular calcifications. IMPRESSION: Transmetatarsal amputation of the fifth ray. No immediate postoperative complication. Electronically Signed   By: Narda Rutherford M.D.   On: 12/11/2019 19:54   DG Foot Complete Left  Result Date: 12/10/2019 CLINICAL DATA:  Laceration, infection EXAM: LEFT FOOT - COMPLETE 3+ VIEW COMPARISON:  None. FINDINGS: No fracture or dislocation of the left foot. There is a soft tissue wound overlying the distal fifth metatarsal and metatarsophalangeal joint overlying bandage material and some evidence of subcutaneous emphysema. Mild first metatarsophalangeal arthrosis. Soft tissue edema. Vascular calcinosis. IMPRESSION: 1. No fracture or dislocation of the left foot. No radiopaque foreign body. 2. There is a soft tissue wound overlying the distal fifth metatarsal and metatarsophalangeal joint. There is some evidence of subcutaneous emphysema, concerning for gas-forming infection. Electronically Signed   By: Lauralyn Primes M.D.   On: 12/10/2019 12:43    EKG: Normal sinus rhythm otherwise normal EKG  Weights: Filed Weights   12/10/19 1209 12/11/19 0000 12/11/19 1334  Weight: 79.4 kg 83.4 kg 83.4  kg     Physical Exam: Blood pressure (!) 145/76, pulse 73, temperature 98.5 F (36.9 C), temperature source Oral, resp. rate 16, height 5\' 9"  (1.753 m), weight 83.4 kg, SpO2 97 %. Body mass index is 27.15 kg/m. General: Well developed, well nourished, in no acute distress. Head eyes ears nose throat: Normocephalic, atraumatic, sclera non-icteric, no xanthomas, nares are without discharge. No apparent thyromegaly and/or mass  Lungs: Normal respiratory effort.  no wheezes, no rales, no rhonchi.  Heart: RRR with normal S1 S2. no murmur gallop, no rub, PMI is normal size and placement, carotid upstroke normal without bruit, jugular venous pressure is normal Abdomen: Soft, non-tender, non-distended with normoactive bowel sounds. No hepatomegaly. No rebound/guarding. No obvious abdominal masses. Abdominal aorta is normal size without bruit Extremities: No edema.  Positive cyanosis, no clubbing, positive ulcers  Peripheral : 2+ bilateral upper extremity pulses, 2+ bilateral femoral pulses, 0+ bilateral dorsal pedal pulse Neuro: Alert and oriented. No facial asymmetry. No focal deficit. Moves all extremities spontaneously. Musculoskeletal: Normal muscle tone without kyphosis Psych:  Responds to questions appropriately with a normal affect.    Assessment: 64 year old male with known hypertension hyperlipidemia diabetes peripheral vascular disease with osteomyelitis and now MRSA bacteremia concerning for other sources of infection including endocarditis.  Plan: 1.  Proceed to transesophageal echocardiogram for further evaluation of possible endocarditis as a cause of MSRA bacteremia.  Patient understands the risk and benefits of transesophageal echocardiogram.  This includes the possibility of death stroke heart attack esophageal perforation sore throat rhythm disturbances.  Patient is at low risk for conscious sedation  Signed, 65 M.D. [facility] 12/12/2019, 8:00  PM

## 2019-12-12 NOTE — Consult Note (Signed)
NAME: Edward Richard  DOB: 12/24/1955  MRN: [id number]  Date/Time: 12/12/2019 9:34 PM  REQUESTING PROVIDER: Dr. Barnett Abu Subjective:  REASON FOR CONSULT: MRSA bacteremia ? Edward Richard is a 64 y.o. male with a history of CAD, diabetes, COPD, hypercholesterolemia admitted with left foot pain and swelling. As per patient he fell a week ago while trying to get hold of his mom's dog and scratched his foot on the bricks. He took care of the wound himself. It became so painful and swollen and during his visit to his endocrinologist on 12/10/2019 he asked him to take a look at it. The endocrinologist noted a 6 to 8 cm in length and 3 to 4 cm with wound with visible necrotic tissue in it which was extremely painful to touch with the small to massively edematous. He was sent to the emergency department for evaluation and treatment. Patient got admitted to the hospital. In the ED his temperature was 100.7, blood pressure 170/76, respiratory rate of 18. Blood cultures were sent. MRI of the foot revealed ulceration of the lateral aspect of the fifth metatarsal head extending to the bone with underlying osteomyelitis of the fifth metatarsal head and fifth proximal phalanx. WBC was 13.5, hemoglobin of 11.5 and platelet of 357. Creatinine was 1.07 and blood glucose was 217. On 12/11/2019 he underwent an angiogram the one Lee abnormality was the left posterior tibial artery was patent in its proximal few centimeters and then demonstrated 3 to 4 cm segment of diffuse greater than 80% stenosis. Below this the posterior tibial was reconstituted and had normal caliber and fills the medial and lateral plantar vessels. No intervention was indicated. He underwent fifth ray excision of the left foot on 12/11/2019 by Dr. Excell Seltzer. As the blood culture is positive for MRSA I am seeing the patient.   Past Medical History:  Diagnosis Date  . COPD (chronic obstructive pulmonary disease) (HCC)   . Diabetes mellitus without  complication (HCC)   . Hypercholesteremia   . Hypertension   . Myocardial infarction [facility])     Past Surgical History:  Procedure Laterality Date  . AMPUTATION Left 12/11/2019   Procedure: AMPUTATION RAY 5TH RAY PARTIAL;  Surgeon: Rosetta Posner, DPM;  Location: [facility] ORS;  Service: Podiatry;  Laterality: Left;  . INCISION AND DRAINAGE Left 12/11/2019   Procedure: INCISION AND DRAINAGE;  Surgeon: Rosetta Posner, DPM;  Location: [facility] ORS;  Service: Podiatry;  Laterality: Left;  . LOWER EXTREMITY ANGIOGRAPHY Left 12/11/2019   Procedure: Lower Extremity Angiography;  Surgeon: Renford Dills, MD;  Location: [facility] INVASIVE CV LAB;  Service: Cardiovascular;  Laterality: Left;  . NO PAST SURGERIES      Social History   Socioeconomic History  . Marital status: Legally Separated    Spouse name: Not on file  . Number of children: Not on file  . Years of education: Not on file  . Highest education level: Not on file  Occupational History  . Not on file  Tobacco Use  . Smoking status: Current Every Day Smoker    Packs/day: 0.50    Types: Cigarettes  . Smokeless tobacco: Never Used  Substance and Sexual Activity  . Alcohol use: Yes  . Drug use: No  . Sexual activity: Yes  Other Topics Concern  . Not on file  Social History Narrative  . Not on file   Social Determinants of Health   Financial Resource Strain:   . Difficulty of Paying Living Expenses:   Food Insecurity:   .  Worried About Programme researcher, broadcasting/film/videounning Out of Food in the Last Year:   . Baristaan Out of Food in the Last Year:   Transportation Needs:   . Freight forwarderLack of Transportation (Medical):   Marland Kitchen. Lack of Transportation (Non-Medical):   Physical Activity:   . Days of Exercise per Week:   . Minutes of Exercise per Session:   Stress:   . Feeling of Stress :   Social Connections:   . Frequency of Communication with Friends and Family:   . Frequency of Social Gatherings with Friends and Family:   . Attends Religious Services:   . Active Member of Clubs or  Organizations:   . Attends BankerClub or Organization Meetings:   Marland Kitchen. Marital Status:   Intimate Partner Violence:   . Fear of Current or Ex-Partner:   . Emotionally Abused:   Marland Kitchen. Physically Abused:   . Sexually Abused:     Family History  Problem Relation Age of Onset  . Cancer Mother   . Heart failure Father   . Cancer Father    No Known Allergies  ? Current Facility-Administered Medications  Medication Dose Route Frequency Provider Last Rate Last Admin  . 0.9 %  sodium chloride infusion  250 mL Intravenous PRN Schnier, Latina CraverGregory G, MD      . acetaminophen (TYLENOL) tablet 650 mg  650 mg Oral Q6H PRN Schnier, Latina CraverGregory G, MD   650 mg at 12/12/19 1816   Or  . acetaminophen (TYLENOL) suppository 650 mg  650 mg Rectal Q6H PRN Schnier, Latina CraverGregory G, MD      . amLODipine (NORVASC) tablet 10 mg  10 mg Oral Daily Schnier, Latina CraverGregory G, MD   10 mg at 12/12/19 0948  . Ampicillin-Sulbactam (UNASYN) 3 g in sodium chloride 0.9 % 100 mL IVPB  3 g Intravenous Q6H Schnier, Latina CraverGregory G, MD 200 mL/hr at 12/12/19 2056 3 g at 12/12/19 2056  . atorvastatin (LIPITOR) tablet 20 mg  20 mg Oral QPM Schnier, Latina CraverGregory G, MD   20 mg at 12/12/19 1816  . bisacodyl (DULCOLAX) EC tablet 5 mg  5 mg Oral Daily PRN Schnier, Latina CraverGregory G, MD      . DULoxetine (CYMBALTA) DR capsule 30 mg  30 mg Oral Daily Schnier, Latina CraverGregory G, MD   30 mg at 12/12/19 0949  . enoxaparin (LOVENOX) injection 40 mg  40 mg Subcutaneous Q24H Schnier, Latina CraverGregory G, MD   40 mg at 12/11/19 2120  . gabapentin (NEURONTIN) capsule 800 mg  800 mg Oral TID Renford DillsSchnier, Gregory G, MD   800 mg at 12/12/19 1816  . hydrALAZINE (APRESOLINE) injection 5 mg  5 mg Intravenous Q20 Min PRN Schnier, Latina CraverGregory G, MD      . hydrALAZINE (APRESOLINE) tablet 50 mg  50 mg Oral Q6H PRN Schnier, Latina CraverGregory G, MD      . HYDROmorphone (DILAUDID) injection 0.5 mg  0.5 mg Intravenous Q4H PRN Schnier, Latina CraverGregory G, MD   0.5 mg at 12/11/19 0505  . insulin aspart (novoLOG) injection 0-15 Units  0-15 Units  Subcutaneous TID WC Schnier, Latina CraverGregory G, MD   5 Units at 12/12/19 1254  . insulin aspart (novoLOG) injection 4 Units  4 Units Subcutaneous TID WC Dhungel, Nishant, MD   4 Units at 12/12/19 1255  . insulin glargine (LANTUS) injection 20 Units  20 Units Subcutaneous Daily Dhungel, Nishant, MD   20 Units at 12/12/19 0949  . labetalol (NORMODYNE) injection 10 mg  10 mg Intravenous Q10 min PRN Schnier, Latina CraverGregory G, MD      .  metoprolol tartrate (LOPRESSOR) tablet 25 mg  25 mg Oral BID Schnier, Latina Craver, MD   25 mg at 12/12/19 1000  . morphine 2 MG/ML injection 2 mg  2 mg Intravenous Q1H PRN Schnier, Latina Craver, MD      . nicotine (NICODERM CQ - dosed in mg/24 hours) patch 14 mg  14 mg Transdermal Daily Schnier, Latina Craver, MD   14 mg at 12/12/19 0950  . ondansetron (ZOFRAN) tablet 4 mg  4 mg Oral Q6H PRN Schnier, Latina Craver, MD       Or  . ondansetron [facility]) injection 4 mg  4 mg Intravenous Q6H PRN Schnier, Latina Craver, MD      . ondansetron [facility]) injection 4 mg  4 mg Intravenous Q6H PRN Schnier, Latina Craver, MD      . oxyCODONE (Oxy IR/ROXICODONE) immediate release tablet 10 mg  10 mg Oral Q6H PRN Schnier, Latina Craver, MD   10 mg at 12/12/19 1254  . polyethylene glycol (MIRALAX / GLYCOLAX) packet 17 g  17 g Oral Daily Dhungel, Nishant, MD   17 g at 12/12/19 1833  . sodium chloride flush (NS) 0.9 % injection 3 mL  3 mL Intravenous Q12H Schnier, Latina Craver, MD   3 mL at 12/12/19 1243  . sodium chloride flush (NS) 0.9 % injection 3 mL  3 mL Intravenous Q12H Schnier, Latina Craver, MD   3 mL at 12/12/19 1000  . sodium chloride flush (NS) 0.9 % injection 3 mL  3 mL Intravenous PRN Schnier, Latina Craver, MD      . vancomycin (VANCOCIN) IVPB 1000 mg/200 mL premix  1,000 mg Intravenous Q12H Schnier, Latina Craver, MD 200 mL/hr at 12/12/19 1000 1,000 mg at 12/12/19 1000     Abtx:  Anti-infectives (From admission, onward)   Start     Dose/Rate Route Frequency Ordered Stop   12/12/19 0000  ceFAZolin (ANCEF) IVPB 2g/100 mL  premix  Status:  Discontinued    Note to Pharmacy: To be given in specials   2 g 200 mL/hr over 30 Minutes Intravenous  Once 12/11/19 1314 12/11/19 1344   12/11/19 1741  vancomycin (VANCOCIN) powder  Status:  Discontinued       As needed 12/11/19 1741 12/11/19 1927   12/11/19 1400  cefTRIAXone (ROCEPHIN) 1 g in sodium chloride 0.9 % 100 mL IVPB  Status:  Discontinued     1 g 200 mL/hr over 30 Minutes Intravenous Every 24 hours 12/10/19 1715 12/10/19 1720   12/11/19 1000  vancomycin (VANCOREADY) IVPB 1750 mg/350 mL  Status:  Discontinued     1,750 mg 175 mL/hr over 120 Minutes Intravenous Every 24 hours 12/10/19 2022 12/11/19 0952   12/11/19 1000  vancomycin (VANCOCIN) IVPB 1000 mg/200 mL premix     1,000 mg 200 mL/hr over 60 Minutes Intravenous Every 12 hours 12/11/19 0952     12/10/19 1730  Ampicillin-Sulbactam (UNASYN) 3 g in sodium chloride 0.9 % 100 mL IVPB     3 g 200 mL/hr over 30 Minutes Intravenous Every 6 hours 12/10/19 1720     12/10/19 1615  vancomycin (VANCOCIN) IVPB 1000 mg/200 mL premix     1,000 mg 200 mL/hr over 60 Minutes Intravenous  Once 12/10/19 1614 12/10/19 1852   12/10/19 1615  cefTRIAXone (ROCEPHIN) 2 g in sodium chloride 0.9 % 100 mL IVPB  Status:  Discontinued     2 g 200 mL/hr over 30 Minutes Intravenous  Once 12/10/19 1614 12/10/19 1717  REVIEW OF SYSTEMS:  Const: negative fever, negative chills, negative weight loss Eyes: negative diplopia or visual changes, negative eye pain ENT: negative coryza, negative sore throat Resp: negative cough, hemoptysis, dyspnea Cards: negative for chest pain, palpitations, lower extremity edema GU: negative for frequency, dysuria and hematuria GI: Negative for abdominal pain, diarrhea, bleeding, constipation Skin: negative for rash and pruritus Heme: negative for easy bruising and gum/nose bleeding MS: Left foot pain Neurolo:negative for headaches, dizziness, vertigo, memory problems  Psych: negative for feelings  of anxiety, depression  Endocrine: Has diabetes Allergy/Immunology- negative for any medication or food allergies ?  Objective:  VITALS:  BP (!) 145/76 (BP Location: Left Arm)   Pulse 73   Temp 98.5 F (36.9 C) (Oral)   Resp 16   Ht 5\' 9"  (1.753 m)   Wt 83.4 kg   SpO2 97%   BMI 27.15 kg/m  PHYSICAL EXAM:  General: Alert, cooperative, no distress, appears stated age.  Head: Normocephalic, without obvious abnormality, atraumatic. Eyes: Conjunctivae clear, anicteric sclerae. Pupils are equal ENT Nares normal. No drainage or sinus tenderness. Lips, mucosa, and tongue normal. No Thrush Neck: Supple, symmetrical, no adenopathy, thyroid: non tender no carotid bruit and no JVD. Back: No CVA tenderness. Lungs: Clear to auscultation bilaterally. No Wheezing or Rhonchi. No rales. Heart: Regular rate and rhythm, no murmur, rub or gallop. Abdomen: Soft, non-tender,not distended. Bowel sounds normal. No masses Extremities: Left foot surgical dressing not removed Pictures reviewed Presurgery  Post surgery   Skin: No rashes or lesions. Or bruising Lymph: Cervical, supraclavicular normal. Neurologic: Grossly non-focal Pertinent Labs Lab Results CBC    Component Value Date/Time   WBC 8.2 12/12/2019 0550   RBC 3.30 (L) 12/12/2019 0550   HGB 9.7 (L) 12/12/2019 0550   HGB 14.1 06/05/2014 2317   HCT 29.4 (L) 12/12/2019 0550   HCT 42.5 06/05/2014 2317   PLT 301 12/12/2019 0550   PLT 198 06/05/2014 2317   MCV 89.1 12/12/2019 0550   MCV 91 06/05/2014 2317   MCH 29.4 12/12/2019 0550   MCHC 33.0 12/12/2019 0550   RDW 12.2 12/12/2019 0550   RDW 13.1 06/05/2014 2317   LYMPHSABS 1.5 12/10/2019 1213   MONOABS 1.0 12/10/2019 1213   EOSABS 0.0 12/10/2019 1213   BASOSABS 0.1 12/10/2019 1213    CMP Latest Ref Rng & Units 12/12/2019 12/11/2019 12/10/2019  Glucose 70 - 99 mg/dL 02/09/2020) 161(W) 960(A)  BUN 8 - 23 mg/dL 19 20 17   Creatinine 0.61 - 1.24 mg/dL 540(J 8.11  Sodium 135 - 145  mmol/L 136 134(L) 134(L)  Potassium 3.5 - 5.1 mmol/L 4.5 4.4 3.9  Chloride 98 - 111 mmol/L 107 103 101  CO2 22 - 32 mmol/L 25 25 22   Calcium 8.9 - 10.3 mg/dL 7.7(L) 7.8(L) 8.5(L)  Total Protein 6.5 - 8.1 g/dL - - 8.6(H)  Total Bilirubin 0.3 - 1.2 mg/dL - - 0.9  Alkaline Phos 38 - 126 U/L - - 70  AST 15 - 41 U/L - - 15  ALT 0 - 44 U/L - - 9      Microbiology: Recent Results (from the past 240 hour(s))  Culture, blood (Routine x 2)     Status: Abnormal (Preliminary result)   Collection Time: 12/10/19 12:13 PM   Specimen: BLOOD  Result Value Ref Range Status   Specimen Description   Final    BLOOD RIGHT ANTECUBITAL Performed at [facility], [address] [place], [facility] [address]    Special Requests  Final    BOTTLES DRAWN AEROBIC AND ANAEROBIC Blood Culture results may not be optimal due to an excessive volume of blood received in culture bottles Performed at [facility], [address] [place], [place] [address]    Culture  Setup Time   Final    Organism ID to follow GRAM POSITIVE COCCI ANAEROBIC BOTTLE ONLY CRITICAL RESULT CALLED TO, READ BACK BY AND VERIFIED WITH: JASON ROBBINS 12/11/19 1647 KLW Performed at [facility], [address], [place], [place] [address]    Culture (A)  Final    STAPHYLOCOCCUS AUREUS SUSCEPTIBILITIES TO FOLLOW Performed at [facility], [address], [place], [place] [address]    Report Status PENDING  Incomplete  Blood Culture ID Panel (Reflexed)     Status: Abnormal   Collection Time: 12/10/19 12:13 PM  Result Value Ref Range Status   Enterococcus species NOT DETECTED NOT DETECTED Final   Listeria monocytogenes NOT DETECTED NOT DETECTED Final   Staphylococcus species DETECTED (A) NOT DETECTED Corrected    Comment: CRITICAL RESULT CALLED TO, READ BACK BY AND VERIFIED WITH: JASON ROBBINS 12/11/19 1647 KLW CORRECTED ON 06/09 AT 0853: PREVIOUSLY REPORTED AS DETECTED CRITICAL RESULT CALLED TO, READ BACK  BY AND VERIFIED WITH: JASON ROBBINS 12/11/19 KLW    Staphylococcus aureus (BCID) DETECTED (A) NOT DETECTED Corrected    Comment: Methicillin (oxacillin)-resistant Staphylococcus aureus (MRSA). MRSA is predictably resistant to beta-lactam antibiotics (except ceftaroline). Preferred therapy is vancomycin unless clinically contraindicated. Patient requires contact precautions if  hospitalized. CRITICAL RESULT CALLED TO, READ BACK BY AND VERIFIED WITH: JASON ROBBINS 12/11/19 1647 KLW CORRECTED ON 06/09 AT 0853: PREVIOUSLY REPORTED AS DETECTED Methicillin (oxacillin) resistant Staphylococcus aureus (MRSA). MRSA is predictably resistant to beta lactam antibiotics (except ceftaroline). Preferred therapy is vancomycin unless clinically  contraindicated. Patient requires contact precautions if hospitalized. CRITICAL RESULT CALLED TO, READ BACK BY AND VERIFIED WITH: JASON ROBBINS 12/11/19 KLW    Methicillin resistance DETECTED (A) NOT DETECTED Final    Comment: CRITICAL RESULT CALLED TO, READ BACK BY AND VERIFIED WITH: JASON ROBBINS 12/11/19 1647 KLW    Streptococcus species NOT DETECTED NOT DETECTED Final   Streptococcus agalactiae NOT DETECTED NOT DETECTED Final   Streptococcus pneumoniae NOT DETECTED NOT DETECTED Final   Streptococcus pyogenes NOT DETECTED NOT DETECTED Final   Acinetobacter baumannii NOT DETECTED NOT DETECTED Final   Enterobacteriaceae species NOT DETECTED NOT DETECTED Final   Enterobacter cloacae complex NOT DETECTED NOT DETECTED Final   Escherichia coli NOT DETECTED NOT DETECTED Final   Klebsiella oxytoca NOT DETECTED NOT DETECTED Final   Klebsiella pneumoniae NOT DETECTED NOT DETECTED Final   Proteus species NOT DETECTED NOT DETECTED Final   Serratia marcescens NOT DETECTED NOT DETECTED Final   Haemophilus influenzae NOT DETECTED NOT DETECTED Final   Neisseria meningitidis NOT DETECTED NOT DETECTED Final   Pseudomonas aeruginosa NOT DETECTED NOT DETECTED Final   Candida albicans NOT  DETECTED NOT DETECTED Final   Candida glabrata NOT DETECTED NOT DETECTED Final   Candida krusei NOT DETECTED NOT DETECTED Final   Candida parapsilosis NOT DETECTED NOT DETECTED Final   Candida tropicalis NOT DETECTED NOT DETECTED Final    Comment: Performed at [facility], [address] [place], [place] [address]  Aerobic/Anaerobic Culture (surgical/deep wound)     Status: None (Preliminary result)   Collection Time: 12/10/19  7:26 PM   Specimen: Wound  Result Value Ref Range Status   Specimen Description   Final  WOUND Performed at [facility] [address] [place], [place] [address]    Special Requests   Final    NONE Performed at [facility], [address] [place], [place] [address]    Gram Stain   Final    NO WBC SEEN ABUNDANT GRAM POSITIVE COCCI ABUNDANT GRAM NEGATIVE RODS    Culture   Final    CULTURE REINCUBATED FOR BETTER GROWTH Performed at [facility], [place] [address] [place], [place] [address]    Report Status PENDING  Incomplete  SARS Coronavirus 2 by RT PCR (hospital order, performed in [facility] hospital lab) Nasopharyngeal Nasopharyngeal Swab     Status: None   Collection Time: 12/10/19 10:16 PM   Specimen: Nasopharyngeal Swab  Result Value Ref Range Status   SARS Coronavirus 2 NEGATIVE NEGATIVE Final    Comment: (NOTE) SARS-CoV-2 target nucleic acids are NOT DETECTED. The SARS-CoV-2 RNA is generally detectable in upper and lower respiratory specimens during the acute phase of infection. The lowest concentration of SARS-CoV-2 viral copies this assay can detect is 250 copies / mL. A negative result does not preclude SARS-CoV-2 infection and should not be used as the sole basis for treatment or other patient management decisions.  A negative result may occur with improper specimen collection / handling, submission of specimen other than nasopharyngeal swab, presence of viral mutation(s) within the areas  targeted by this assay, and inadequate number of viral copies (<250 copies / mL). A negative result must be combined with clinical observations, patient history, and epidemiological information. Fact Sheet for Patients:   StrictlyIdeas.no Fact Sheet for Healthcare Providers: BankingDealers.co.za This test is not yet approved or cleared  by the [place] FDA and has been authorized for detection and/or diagnosis of SARS-CoV-2 by FDA under an Emergency Use Authorization (EUA).  This EUA will remain in effect (meaning this test can be used) for the duration of the COVID-19 declaration under Section 564(b)(1) of the Act, 21 U.S.C. section 360bbb-3(b)(1), unless the authorization is terminated or revoked sooner. Performed at [facility], [address] [place], [place] [address]   Surgical pcr screen     Status: None   Collection Time: 12/11/19 12:11 AM   Specimen: Nasal Mucosa; Nasal Swab  Result Value Ref Range Status   MRSA, PCR NEGATIVE NEGATIVE Final   Staphylococcus aureus NEGATIVE NEGATIVE Final    Comment: (NOTE) The Xpert SA Assay (FDA approved for NASAL specimens in patients 33 years of age and older), is one component of a comprehensive surveillance program. It is not intended to diagnose infection nor to guide or monitor treatment. Performed at [facility], [address] [place], [place] [address]   Aerobic/Anaerobic Culture (surgical/deep wound)     Status: None (Preliminary result)   Collection Time: 12/11/19  5:37 PM   Specimen: PATH Digit amputation; Tissue  Result Value Ref Range Status   Specimen Description   Final    WOUND LEFT 5TH DIGIT Performed at [facility], [address], [place], [place] [address]    Special Requests   Final    NONE Performed at [facility], [address] [place],  [address]    Gram Stain   Final    FEW WBC PRESENT, PREDOMINANTLY  PMN FEW GRAM POSITIVE COCCI RARE GRAM NEGATIVE RODS    Culture   Final    CULTURE REINCUBATED FOR BETTER GROWTH Performed at [facility], [place] [address]  [place], [place] [address]    Report Status PENDING  Incomplete    IMAGING RESULTS: Ulceration at the lateral aspect of the fifth metatarsal head extending to bone with underlying osteomyelitis of the fifth metatarsal head and fifth proximal phalanx. 2. Small area of devitalized soft tissue adjacent to the ulceration. Cellulitis of the dorsal lateral forefoot. No abscess I have personally reviewed the films ? Impression/Recommendation ? 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 MRSA bacteremia Likely source is the foot We will repeat blood cultures Will need TEE as we do not know the duration of the bacteremia. Depending on the TEE results as well as the pathology of the bone and proximal margin we will decide on duration of antibiotic .  He has a history of hepatitis C He denies IV drug use   Diabetes mellitus on Lantus  Coronary artery disease status post stent on Plavix, Hypertension on metoprolol, and amlodipine Hyperlipidemia on simvastatin ? ___________________________________________________ Discussed with patient, and care team. Note:  This document was prepared using Dragon voice recognition software and may include unintentional dictation errors.

## 2019-12-12 NOTE — TOC Progression Note (Signed)
Transition of Care [facility]) - Progression Note    Patient Details  Name: Edward Richard MRN: [id number] Date of Birth: [birth year]  Transition [facility] Care [facility]) CM/SW Contact  Barrie Dunker, RN Phone Number: 12/12/2019, 3:03 PM  Clinical Narrative:    Arman Bogus with [facility] and they can't accept the patient   Expected Discharge Plan: Home w Home Health Services Barriers to Discharge: Continued Medical Work up  Expected Discharge Plan and Services Expected Discharge Plan: Home w Home Health Services   Discharge Planning Services: CM Consult   Living arrangements for the past 2 months: Skilled Nursing Facility, Single Family Home                 DME Arranged: N/A         HH Arranged: PT, RN   Date HH Agency Contacted: 12/12/19 Time HH Agency Contacted: 1456 Representative spoke with at [facility] Agency: [place]   Social Determinants of Health (SDOH) Interventions    Readmission Risk Interventions No flowsheet data found.

## 2019-12-12 NOTE — TOC Progression Note (Signed)
Transition of Care [facility]) - Progression Note    Patient Details  Name: Edward Richard MRN: [id number] Date of Birth: [birth year]  [facility]) CM/SW Contact  Barrie Dunker, RN Phone Number: 12/12/2019, 3:46 PM  Clinical Narrative:    Went in to see the patient again, He had made comments to nursing that he was not going to rehab and giving up his check, I explained that we must have had a misunderstanding I explained I was not looking for rehab as he told me not to, I explained I was looking for Home health services, he stated that his sister and his autn was going to be helping him and he does not want or need any help or any kind of services including Home health, He stated he just wants to know when he is getting out of here to go home   Expected Discharge Plan: Home w Home Health Services Barriers to Discharge: Continued Medical Work up  Expected Discharge Plan and Services Expected Discharge Plan: Home w Home Health Services   Discharge Planning Services: CM Consult   Living arrangements for the past 2 months: Skilled Nursing Facility, Single Family Home                 DME Arranged: N/A         HH Arranged: PT, RN   Date HH Agency Contacted: 12/12/19 Time HH Agency Contacted: 1456 Representative spoke with at [facility] Agency: [place]   Social Determinants of Health (SDOH) Interventions    Readmission Risk Interventions No flowsheet data found.

## 2019-12-12 NOTE — Discharge Instructions (Signed)
Podiatry discharge instructions: 1.  Keep surgical dressings to your left foot clean, dry, and intact.  If for some reason they become saturated or loosened please call clinic for instruction. 2.  Make postoperative appointment within 1 week of your discharge date. 3.  Try to maintain partial weightbearing at all times to left foot with heel contact only.  Try to stay off your left foot is much as possible to ensure proper wound healing.  Do not walk for long periods of time or stand for prolonged periods of time.  Would recommend trying to stay off of it as much possible. 4.  Take antibiotics as prescribed until gone.

## 2019-12-13 ENCOUNTER — Encounter
Admission: EM | Disposition: A | Discharge status: Home / Self Care | Payer: Self-pay | Source: Home / Self Care | Attending: Internal Medicine

## 2019-12-13 ENCOUNTER — Encounter: Payer: Self-pay | Admitting: Internal Medicine

## 2019-12-13 ENCOUNTER — Inpatient Hospital Stay
Admit: 2019-12-13 | Discharge: 2019-12-13 | Disposition: A | Discharge status: Home / Self Care | Payer: Medicaid Other | Attending: Internal Medicine | Admitting: Internal Medicine

## 2019-12-13 DIAGNOSIS — D649 Anemia, unspecified: Secondary | ICD-10-CM

## 2019-12-13 HISTORY — PX: TEE WITHOUT CARDIOVERSION: SHX5443

## 2019-12-13 LAB — CULTURE, BLOOD (ROUTINE X 2)

## 2019-12-13 LAB — GLUCOSE, CAPILLARY
Glucose-Capillary: 147 mg/dL — ABNORMAL HIGH (ref 70–99)
Glucose-Capillary: 119 mg/dL — ABNORMAL HIGH (ref 70–99)
Glucose-Capillary: 103 mg/dL — ABNORMAL HIGH (ref 70–99)
Glucose-Capillary: 142 mg/dL — ABNORMAL HIGH (ref 70–99)
Glucose-Capillary: 165 mg/dL — ABNORMAL HIGH (ref 70–99)

## 2019-12-13 LAB — CREATININE, SERUM
Creatinine, Ser: 0.94 mg/dL (ref 0.61–1.24)
GFR calc Af Amer: >60 mL/min (ref >60)
GFR calc non Af Amer: >60 mL/min (ref >60)

## 2019-12-13 LAB — CBC
HCT: 26.3 % — ABNORMAL LOW (ref 39.0–52.0)
Hemoglobin: 8.8 g/dL — ABNORMAL LOW (ref 13.0–17.0)
MCH: 29.4 pg (ref 26.0–34.0)
MCHC: 33.5 g/dL (ref 30.0–36.0)
MCV: 88 fL (ref 80.0–100.0)
Platelets: 314 10*3/uL (ref 150–400)
RBC: 2.99 MIL/uL — ABNORMAL LOW (ref 4.22–5.81)
RDW: 12.3 % (ref 11.5–15.5)
WBC: 9.1 10*3/uL (ref 4.0–10.5)
nRBC: 0 % (ref 0.0–0.2)

## 2019-12-13 LAB — FERRITIN: Ferritin: 175 ng/mL (ref 24–336)

## 2019-12-13 LAB — IRON AND TIBC
Iron: 17 ug/dL — ABNORMAL LOW (ref 45–182)
Saturation Ratios: 10 % — ABNORMAL LOW (ref 17.9–39.5)
TIBC: 165 ug/dL — ABNORMAL LOW (ref 250–450)
UIBC: 148 ug/dL

## 2019-12-13 LAB — VANCOMYCIN, TROUGH: Vancomycin Tr: 14 ug/mL — ABNORMAL LOW (ref 15–20)

## 2019-12-13 LAB — VITAMIN B12: Vitamin B-12: 366 pg/mL (ref 180–914)

## 2019-12-13 SURGERY — ECHOCARDIOGRAM, TRANSESOPHAGEAL
Anesthesia: Moderate Sedation

## 2019-12-13 MED ORDER — SODIUM CHLORIDE FLUSH 0.9 % IV SOLN
INTRAVENOUS | Status: AC
  Filled 2019-12-13: qty 10

## 2019-12-13 MED ORDER — FENTANYL CITRATE (PF) 100 MCG/2ML IJ SOLN
INTRAMUSCULAR | Status: AC
  Filled 2019-12-13: qty 2

## 2019-12-13 MED ORDER — SODIUM CHLORIDE 0.9 % IV SOLN: INTRAVENOUS | Status: DC

## 2019-12-13 MED ORDER — HYDROMORPHONE HCL 1 MG/ML IJ SOLN
1.0000 mg | INTRAMUSCULAR | Status: DC | PRN
  Administered 2019-12-13: 1 mg via INTRAVENOUS
  Filled 2019-12-13: qty 1

## 2019-12-13 MED ORDER — LIDOCAINE VISCOUS HCL 2 % MT SOLN
OROMUCOSAL | Status: AC
  Filled 2019-12-13: qty 15

## 2019-12-13 MED ORDER — FENTANYL CITRATE (PF) 100 MCG/2ML IJ SOLN
INTRAMUSCULAR | Status: AC | PRN
  Administered 2019-12-13: 50 ug via INTRAVENOUS
  Administered 2019-12-13 (×2): 25 ug via INTRAVENOUS

## 2019-12-13 MED ORDER — VANCOMYCIN HCL 1250 MG/250ML IV SOLN
1250.0000 mg | Freq: Two times a day (BID) | INTRAVENOUS | Status: DC
  Administered 2019-12-13 – 2019-12-15 (×4): 1250 mg via INTRAVENOUS
  Filled 2019-12-13 (×6): qty 250

## 2019-12-13 MED ORDER — MIDAZOLAM HCL 5 MG/5ML IJ SOLN
INTRAMUSCULAR | Status: AC
  Filled 2019-12-13: qty 5

## 2019-12-13 MED ORDER — MIDAZOLAM HCL 5 MG/5ML IJ SOLN
INTRAMUSCULAR | Status: AC | PRN
  Administered 2019-12-13 (×2): 1 mg via INTRAVENOUS
  Administered 2019-12-13: 2 mg via INTRAVENOUS

## 2019-12-13 MED ORDER — BUTAMBEN-TETRACAINE-BENZOCAINE 2-2-14 % EX AERO
INHALATION_SPRAY | CUTANEOUS | Status: AC
  Filled 2019-12-13: qty 5

## 2019-12-13 NOTE — Plan of Care (Signed)

## 2019-12-13 NOTE — Progress Notes (Signed)
*  PRELIMINARY RESULTS* Echocardiogram Echocardiogram Transesophageal has been performed.  Cristela Blue 12/13/2019, 1:16 PM

## 2019-12-13 NOTE — Hospital Course (Signed)
   TRANSESOPHAGEAL ECHOCARDIOGRAM   NAME:  Edward Richard   MRN: [id number] DOB:  04-Nov-1955   ADMIT DATE: 12/10/2019  INDICATIONS:  Bacteremia PROCEDURE:   Informed consent was obtained prior to the procedure. The risks, benefits and alternatives for the procedure were discussed and the patient comprehended these risks.  Risks include, but are not limited to, cough, sore throat, vomiting, nausea, somnolence, esophageal and stomach trauma or perforation, bleeding, low blood pressure, aspiration, pneumonia, infection, trauma to the teeth and death.    After a procedural time-out, the patient was given 3 mg versed and 75 mcg fentanyl to achieve moderate sedation for 13 min.  The oropharynx was anesthetized 3 cc of topical 1% viscous lidocaine.  The transesophageal probe was inserted in the esophagus and stomach without difficulty and multiple views were obtained. I was present for the entire procedure.    COMPLICATIONS:    There were no immediate complications.  FINDINGS:  LEFT VENTRICLE: EF = 55%. No regional wall motion abnormalities.  RIGHT VENTRICLE: Normal size and function.   LEFT ATRIUM: mildly dilated.   LEFT ATRIAL APPENDAGE: No thrombus.   RIGHT ATRIUM: Mild normal  AORTIC VALVE:  Trileaflet. No vegetations. Trivial ai  MITRAL VALVE:    Normal.No vegetations. Mild mr  TRICUSPID VALVE: Normal. Mild tr. No vegetations  PULMONIC VALVE: Grossly normal.  INTERATRIAL SEPTUM: No PFO or ASD. Agitated saline contrast was used.   PERICARDIUM: No effusion  DESCENDING AORTA: normal  CONCLUSION: No vegetations.

## 2019-12-13 NOTE — Progress Notes (Signed)
ID MRSA bacteremia and necrotic infection left foot S/p 5th ray excision TEE neg Blood culture sent this morning Pathology left toe is not back yet Culture Staph and GBS The foot will be reassessed by Dr.Baker tomorrow Will decide on PICC tomorrow if culture is negative  IV vancomycin for 2-4 weeks VS [place]

## 2019-12-13 NOTE — Progress Notes (Signed)
PT Cancellation Note  Patient Details Name: Garner Dullea MRN: [id number] DOB: 05/11/1956   Cancelled Treatment:    Reason Eval/Treat Not Completed: Patient at procedure or test/unavailable, will attempt to see pt at a future date/time as medically appropriate.     Ovidio Hanger PT, DPT 12/13/19, 2:02 PM

## 2019-12-13 NOTE — Progress Notes (Signed)
PROGRESS NOTE    Edward Richard  WJX:[id number] DOB: 1956/04/12 DOA: 12/10/2019 PCP: Preston Fleetingevelo, Adrian Mancheno, MD   Chief Complaint  Patient presents with  . Wound Infection    Brief Narrative:  64 year old male with uncontrolled type 2 diabetes mellitus with peripheral neuropathy, hypertension, hyperlipidemia, ongoing tobacco use presented with  osteomyelitis and gas gangrene of the left fifth metatarsal head and fifth proximal phalanx and cellulitis of dorsal lateral forefoot. 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  Assessment & Plan: Principal problem Diabetic osteomyelitis of the left foot. 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  Recommend partial weightbearing with heel contact in a surgical shoe.  PT consult.  (Refuses home health services upon discharge). Still having a lot of pain on current dose of oxycodone and low-dose Dilaudid.  Will increase dose of Dilaudid to 1 mg every 4 hours as needed. . Wound culture growing gram-positive cocci and gram-negative rods.  Empiric vancomycin and Unasyn.  Pending sensitivity. Vascular surgery consult appreciated.  Aortogram done without significant peripheral vascular disease.  Active Problems:  Diabetic ulcer of left foot associated with uncontrolled type 2 diabetes mellitus (HCC) Continue empiric antibiotics.  A1c of 9.2.  Increase Lantus to 20 units daily and add Premeal aspart 4 units 3 times daily.  Needs tight blood glucose control.  Continue gabapentin for neuropathy.  MRSA bacteremia Positive in 1/2 blood culture.  Continue empiric vancomycin and Unasyn.  TEE negative for vegetation.  Sensitivity pending.  Antibiotic duration per ID.  Hyperlipidemia Continue statin.  Ongoing tobacco use Counseled on cessation.  Nicotine patch.  Essential hypertension Continue amlodipine and metoprolol  Postoperative acute blood loss anemia Hemoglobin dropped was 8.8 from  baseline 13.  Check iron panel, stool for Hemoccult.  DVT prophylaxis: Subcu Lovenox Code Status: Full code Family Communication: None Disposition:   Status is: Inpatient  Remains inpatient appropriate because:Ongoing active pain requiring inpatient pain management.  IV antibiotics for bacteremia, pending final culture and sensitivity   Dispo: The patient is from: Home              Anticipated d/c is to: Home              Anticipated d/c date is: 2 days              Patient currently is not medically stable to d/c.       Consultants:   Podiatry, vascular surgery   Procedures:  Ray amputation left foot  Aortogram   Antimicrobials: IV vancomycin and Unasyn   Subjective: Seen and examined.  Complains of pain in his left foot.  Objective: Vitals:   12/13/19 1305 12/13/19 1315 12/13/19 1330 12/13/19 1404  BP: 126/63 114/66 116/62 135/65  Pulse: 65 63 63 67  Resp: 16 18 18 17   Temp:    98.6 F (37 C)  TempSrc:    Oral  SpO2: 97% 94% 94% 100%  Weight:      Height:        Intake/Output Summary (Last 24 hours) at 12/13/2019 1455 Last data filed at 12/13/2019 1151 Gross per 24 hour  Intake 1644.87 ml  Output 1645 ml  Net -0.13 ml   Filed Weights   12/10/19 1209 12/11/19 0000 12/11/19 1334  Weight: 79.4 kg 83.4 kg 83.4 kg   Physical exam General: In some distress with pain HEENT: Moist mucosa Chest: Clear CVs: Normal S1-S2, no murmurs GI: Soft, nondistended, nontender Musculoskeletal: Clean  dressing over the left foot    Data Reviewed: I have personally reviewed following labs and imaging studies  CBC: Recent Labs  Lab 12/10/19 1213 12/11/19 0530 12/12/19 0550 12/13/19 0000  WBC 13.5* 8.8 8.2 9.1  NEUTROABS 10.9*  --   --   --   HGB 11.5* 10.0* 9.7* 8.8*  HCT 34.4* 30.6* 29.4* 26.3*  MCV 88.2 89.7 89.1 88.0  PLT 357 294 301 829    Basic Metabolic Panel: Recent Labs  Lab 12/10/19 1213 12/11/19 0530 12/12/19 0550 12/13/19 0930  NA  134* 134* 136  --   K 3.9 4.4 4.5  --   CL 101 103 107  --   CO2 22 25 25   --   GLUCOSE 217* 250* 318*  --   BUN 17 20 19   --   CREATININE 1.08 1.07 0.87 0.94  CALCIUM 8.5* 7.8* 7.7*  --   MG  --   --  2.3  --     GFR: Estimated Creatinine Clearance: 80.4 mL/min (by C-G formula based on SCr of 0.94 mg/dL).  Liver Function Tests: Recent Labs  Lab 12/10/19 1213  AST 15  ALT 9  ALKPHOS 70  BILITOT 0.9  PROT 8.6*  ALBUMIN 3.5    CBG: Recent Labs  Lab 12/12/19 1758 12/12/19 2116 12/13/19 0745 12/13/19 1135 12/13/19 1408  GLUCAP 87 146* 147* 119* 103*     Recent Results (from the past 240 hour(s))  Culture, blood (Routine x 2)     Status: Abnormal   Collection Time: 12/10/19 12:13 PM   Specimen: BLOOD  Result Value Ref Range Status   Specimen Description   Final    BLOOD RIGHT ANTECUBITAL Performed at [facility], [address] [place], [place] [address]    Special Requests   Final    BOTTLES DRAWN AEROBIC AND ANAEROBIC Blood Culture results may not be optimal due to an excessive volume of blood received in culture bottles Performed at [facility], [address], [place], [place] [address]    Culture  Setup Time   Final    GRAM POSITIVE COCCI ANAEROBIC BOTTLE ONLY CRITICAL RESULT CALLED TO, READ BACK BY AND VERIFIED WITH: JASON ROBBINS 12/11/19 Center Performed at [place] [address] [place], [place] [address]    Culture METHICILLIN RESISTANT STAPHYLOCOCCUS AUREUS (A)  Final   Report Status 12/13/2019 FINAL  Final   Organism ID, Bacteria METHICILLIN RESISTANT STAPHYLOCOCCUS AUREUS  Final      Susceptibility   Methicillin resistant staphylococcus aureus - MIC*    CIPROFLOXACIN <=0.5 SENSITIVE Sensitive     ERYTHROMYCIN >=8 RESISTANT Resistant     GENTAMICIN <=0.5 SENSITIVE Sensitive     OXACILLIN >=4 RESISTANT Resistant     TETRACYCLINE <=1 SENSITIVE Sensitive     VANCOMYCIN 1 SENSITIVE Sensitive     TRIMETH/SULFA  <=10 SENSITIVE Sensitive     CLINDAMYCIN <=0.25 SENSITIVE Sensitive     RIFAMPIN <=0.5 SENSITIVE Sensitive     Inducible Clindamycin NEGATIVE Sensitive     * METHICILLIN RESISTANT STAPHYLOCOCCUS AUREUS  Blood Culture ID Panel (Reflexed)     Status: Abnormal   Collection Time: 12/10/19 12:13 PM  Result Value Ref Range Status   Enterococcus species NOT DETECTED NOT DETECTED Final   Listeria monocytogenes NOT DETECTED NOT DETECTED Final   Staphylococcus species DETECTED (A) NOT DETECTED Corrected    Comment: CRITICAL RESULT CALLED TO, READ BACK BY AND VERIFIED WITH: JASON ROBBINS 12/11/19 1647  KLW CORRECTED ON 06/09 AT 0853: PREVIOUSLY REPORTED AS DETECTED CRITICAL RESULT CALLED TO, READ BACK BY AND VERIFIED WITH: JASON ROBBINS 12/11/19 KLW    Staphylococcus aureus (BCID) DETECTED (A) NOT DETECTED Corrected    Comment: Methicillin (oxacillin)-resistant Staphylococcus aureus (MRSA). MRSA is predictably resistant to beta-lactam antibiotics (except ceftaroline). Preferred therapy is vancomycin unless clinically contraindicated. Patient requires contact precautions if  hospitalized. CRITICAL RESULT CALLED TO, READ BACK BY AND VERIFIED WITH: JASON ROBBINS 12/11/19 1647 KLW CORRECTED ON 06/09 AT 0853: PREVIOUSLY REPORTED AS DETECTED Methicillin (oxacillin) resistant Staphylococcus aureus (MRSA). MRSA is predictably resistant to beta lactam antibiotics (except ceftaroline). Preferred therapy is vancomycin unless clinically  contraindicated. Patient requires contact precautions if hospitalized. CRITICAL RESULT CALLED TO, READ BACK BY AND VERIFIED WITH: JASON ROBBINS 12/11/19 KLW    Methicillin resistance DETECTED (A) NOT DETECTED Final    Comment: CRITICAL RESULT CALLED TO, READ BACK BY AND VERIFIED WITH: JASON ROBBINS 12/11/19 1647 KLW    Streptococcus species NOT DETECTED NOT DETECTED Final   Streptococcus agalactiae NOT DETECTED NOT DETECTED Final   Streptococcus pneumoniae NOT DETECTED NOT DETECTED Final    Streptococcus pyogenes NOT DETECTED NOT DETECTED Final   Acinetobacter baumannii NOT DETECTED NOT DETECTED Final   Enterobacteriaceae species NOT DETECTED NOT DETECTED Final   Enterobacter cloacae complex NOT DETECTED NOT DETECTED Final   Escherichia coli NOT DETECTED NOT DETECTED Final   Klebsiella oxytoca NOT DETECTED NOT DETECTED Final   Klebsiella pneumoniae NOT DETECTED NOT DETECTED Final   Proteus species NOT DETECTED NOT DETECTED Final   Serratia marcescens NOT DETECTED NOT DETECTED Final   Haemophilus influenzae NOT DETECTED NOT DETECTED Final   Neisseria meningitidis NOT DETECTED NOT DETECTED Final   Pseudomonas aeruginosa NOT DETECTED NOT DETECTED Final   Candida albicans NOT DETECTED NOT DETECTED Final   Candida glabrata NOT DETECTED NOT DETECTED Final   Candida krusei NOT DETECTED NOT DETECTED Final   Candida parapsilosis NOT DETECTED NOT DETECTED Final   Candida tropicalis NOT DETECTED NOT DETECTED Final    Comment: Performed at [facility], [address] [place], [place] [address]  Aerobic/Anaerobic Culture (surgical/deep wound)     Status: None (Preliminary result)   Collection Time: 12/10/19  7:26 PM   Specimen: Wound  Result Value Ref Range Status   Specimen Description   Final    WOUND Performed at [facility], [address] [place], [place] [address]    Special Requests   Final    NONE Performed at [facility], [address] [place], [place] [address]    Gram Stain   Final    NO WBC SEEN ABUNDANT GRAM POSITIVE COCCI ABUNDANT GRAM NEGATIVE RODS    Culture   Final    CULTURE REINCUBATED FOR BETTER GROWTH HOLDING FOR POSSIBLE ANAEROBE Performed at [facility], [address] [place], [place] [address]    Report Status PENDING  Incomplete  SARS Coronavirus 2 by RT PCR (hospital order, performed in [facility] hospital lab) Nasopharyngeal Nasopharyngeal Swab     Status: None   Collection Time: 12/10/19 10:16 PM     Specimen: Nasopharyngeal Swab  Result Value Ref Range Status   SARS Coronavirus 2 NEGATIVE NEGATIVE Final    Comment: (NOTE) SARS-CoV-2 target nucleic acids are NOT DETECTED. The SARS-CoV-2 RNA is generally detectable in upper and lower respiratory specimens during the acute phase of infection. The lowest concentration of SARS-CoV-2 viral copies this assay can detect is 250 copies /  mL. A negative result does not preclude SARS-CoV-2 infection and should not be used as the sole basis for treatment or other patient management decisions.  A negative result may occur with improper specimen collection / handling, submission of specimen other than nasopharyngeal swab, presence of viral mutation(s) within the areas targeted by this assay, and inadequate number of viral copies (<250 copies / mL). A negative result must be combined with clinical observations, patient history, and epidemiological information. Fact Sheet for Patients:   BoilerBrush.com.cy Fact Sheet for Healthcare Providers: https://pope.com/ This test is not yet approved or cleared  by the [place] FDA and has been authorized for detection and/or diagnosis of SARS-CoV-2 by FDA under an Emergency Use Authorization (EUA).  This EUA will remain in effect (meaning this test can be used) for the duration of the COVID-19 declaration under Section 564(b)(1) of the Act, 21 U.S.C. section 360bbb-3(b)(1), unless the authorization is terminated or revoked sooner. Performed at [facility], [address], [place], [place] [address]   Surgical pcr screen     Status: None   Collection Time: 12/11/19 12:11 AM   Specimen: Nasal Mucosa; Nasal Swab  Result Value Ref Range Status   MRSA, PCR NEGATIVE NEGATIVE Final   Staphylococcus aureus NEGATIVE NEGATIVE Final    Comment: (NOTE) The Xpert SA Assay (FDA approved for NASAL specimens in patients 38 years of age and older), is  one component of a comprehensive surveillance program. It is not intended to diagnose infection nor to guide or monitor treatment. Performed at [facility], [address] [place], [place] [address]   Aerobic/Anaerobic Culture (surgical/deep wound)     Status: None (Preliminary result)   Collection Time: 12/11/19  5:37 PM   Specimen: PATH Digit amputation; Tissue  Result Value Ref Range Status   Specimen Description   Final    WOUND LEFT 5TH DIGIT Performed at [facility], [address] [place], [place] [address]    Special Requests   Final    NONE Performed at [facility], [address] [place], [place] [address]    Gram Stain   Final    FEW WBC PRESENT, PREDOMINANTLY PMN FEW GRAM POSITIVE COCCI RARE GRAM NEGATIVE RODS    Culture   Final    RARE STAPHYLOCOCCUS AUREUS FEW STREPTOCOCCUS GALLOLYTICUS NO ANAEROBES ISOLATED; CULTURE IN PROGRESS FOR 5 DAYS SUSCEPTIBILITIES TO FOLLOW Performed at [facility], [address], [place], [place] [address]    Report Status PENDING  Incomplete  CULTURE, BLOOD (ROUTINE X 2) w Reflex to ID Panel     Status: None (Preliminary result)   Collection Time: 12/13/19 12:00 AM   Specimen: BLOOD  Result Value Ref Range Status   Specimen Description BLOOD RAC  Final   Special Requests BOTTLES DRAWN AEROBIC AND ANAEROBIC BCAV  Final   Culture   Final    NO GROWTH < 12 HOURS Performed at [facility], [address] [place], [place] [address]    Report Status PENDING  Incomplete  CULTURE, BLOOD (ROUTINE X 2) w Reflex to ID Panel     Status: None (Preliminary result)   Collection Time: 12/13/19 12:11 AM   Specimen: BLOOD  Result Value Ref Range Status   Specimen Description BLOOD RH  Final   Special Requests BOTTLES DRAWN AEROBIC AND ANAEROBIC BCAV  Final   Culture   Final    NO GROWTH < 12 HOURS Performed at [facility], [address] [place], [place] [address]    Report  Status PENDING  Incomplete         Radiology Studies: DG Foot Complete Left  Result Date: 12/11/2019 CLINICAL DATA:  Postop. EXAM: LEFT FOOT - COMPLETE 3+ VIEW COMPARISON:  Preoperative radiograph yesterday FINDINGS: Transmetatarsal amputation of the fifth ray. The remainder of the foot is intact. No evidence of osteomyelitis. Again seen plantar calcaneal spur and Achilles tendon enthesophyte. Well corticated density adjacent to the dorsal talus, favor sequela of remote prior injury. Vascular calcifications. IMPRESSION: Transmetatarsal amputation of the fifth ray. No immediate postoperative complication. Electronically Signed   By: Narda Rutherford M.D.   On: 12/11/2019 19:54        Scheduled Meds: . amLODipine  10 mg Oral Daily  . atorvastatin  20 mg Oral QPM  . DULoxetine  30 mg Oral Daily  . enoxaparin (LOVENOX) injection  40 mg Subcutaneous Q24H  . fentaNYL      . gabapentin  800 mg Oral TID  . insulin aspart  0-15 Units Subcutaneous TID WC  . insulin aspart  4 Units Subcutaneous TID WC  . insulin glargine  20 Units Subcutaneous Daily  . metoprolol tartrate  25 mg Oral BID  . midazolam      . nicotine  14 mg Transdermal Daily  . polyethylene glycol  17 g Oral Daily  . sodium chloride flush  3 mL Intravenous Q12H  . sodium chloride flush  3 mL Intravenous Q12H  . sodium chloride flush       Continuous Infusions: . sodium chloride    . ampicillin-sulbactam (UNASYN) IV 3 g (12/13/19 1037)  . vancomycin 1,000 mg (12/13/19 1220)     LOS: 3 days    Time spent: 35 minutes    Alishia Lebo, MD [facility]   To contact the attending provider between 7A-7P or the covering provider during after hours 7P-7A, please log into the web site [email or web address] and access using universal [employer] password for that web site. If you do not have the password, please call the hospital operator.  12/13/2019, 2:55 PM

## 2019-12-13 NOTE — Consult Note (Signed)
Pharmacy Antibiotic Note  Edward Richard is a 64 y.o. male admitted on 12/10/2019 with left fifth metatarsal phalangeal joint osteomyelitis with gas gangrene present and associated cellulitis, now with MRSA bacteremia.  Presented to ED with nonhealing foot wound.  Patient underwent 5th ray excision on 6/8.  TEE negative.  Pharmacy has been consulted for Vancomycin dosing. Patient also receiving Unasyn currently.  Plan: Day 4 antibiotics  Goal trough 15-20 in the setting of bacteremia.  0610 @ 0930 VT returned today at 14.  Will increase dose slightly to 1250 mg IV BID.  Pharmacy will continue to monitor renal function, and order levels as clinically indicated.    Height: 5\' 9"  (175.3 cm) Weight: 83.4 kg (183 lb 13.8 oz) IBW/kg (Calculated) : 70.7  Temp (24hrs), Avg:98.5 F (36.9 C), Min:98.2 F (36.8 C), Max:99.3 F (37.4 C)  Recent Labs  Lab 12/10/19 1213 12/10/19 1411 12/11/19 0530 12/12/19 0550 12/13/19 0000 12/13/19 0930  WBC 13.5*  --  8.8 8.2 9.1  --   CREATININE 1.08  --  1.07 0.87  --  0.94  LATICACIDVEN 0.9 1.0  --   --   --   --   VANCOTROUGH  --   --   --   --   --  14*    Estimated Creatinine Clearance: 80.4 mL/min (by C-G formula based on SCr of 0.94 mg/dL).    No Known Allergies  Antimicrobials this admission: Vancomycin 6/7 >>  Unasyn 6/7 >> Ceftriaxone 6/7 x 1  Microbiology results: 6/10 BCx:  NG x 12 h 6/7 BCx:  MRSA 6/8 MRSA PCR:  Negative 6/8 Wound cx (digit) Staph aureus, Strep gallolyticus 6/7 Wound cx: reincubated (abundant GPC/GNR) 6/7 COVID (nasopharyngeal swab):  negative  Thank you for allowing pharmacy to be a part of this patients care.  8/7, PharmD 12/13/2019 4:25 PM

## 2019-12-13 NOTE — Evaluation (Signed)
Physical Therapy Evaluation Patient Details Name: Edward Richard MRN: [id number] DOB: [birth year] Today's Date: 12/13/2019   History of Present Illness  Pt is a 64 y/o M with PMH that includes DM2, peripheral neuropathy, HLD, HTN who presents w/ left foot wound x 5 days.  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  MD assessment also includes: MRSA bacteremia, HLD, HTN, and postoperative acute blood loss anemia.    Clinical Impression  Pt motivated to return home and put forth good effort during the session but required frequent cues to ensure compliance with WB orders.  Pt required no physical assistance during the session but transferring from the bed to the chair with a RW was effortful requiring increased time and cuing for sequencing.  Pt is at high risk for WB non-compliance and would benefit from PT services in a SNF setting upon discharge to safely address deficits listed in patient problem list for decreased caregiver assistance and eventual return to PLOF.  Patient suffers from left partial fifth ray amputation which impairs his ability to perform daily activities like toileting, feeding, dressing, grooming, bathing in the home. A cane, walker, crutch will not resolve the patient's issue with performing activities of daily living. A lightweight wheelchair and cushion is required/recommended and will allow patient to safely perform daily activities. Patient can safely propel the wheelchair in the home or has a caregiver who can provide assistance.      Follow Up Recommendations SNF;Supervision for mobility/OOB    Equipment Recommendations  Rolling walker with 5" wheels;3in1 (PT);Wheelchair with elevating leg rests;Other (comment) (tub transfer bench)    Recommendations for Other  Services       Precautions / Restrictions Precautions Precautions: Fall Restrictions Weight Bearing Restrictions: Yes LLE Weight Bearing: Partial weight bearing LLE Partial Weight Bearing Percentage or Pounds: Per Dr. Excell Seltzer, [facility] through the L heel only with surgical shoe donned for transfers only, avoid ambulation and prolonged standing.  OK for patient to practice stair training with minimal heel WB.      Mobility  Bed Mobility Overal bed mobility: Modified Independent             General bed mobility comments: Extra time and effort only  Transfers Overall transfer level: Needs assistance Equipment used: Rolling walker (2 wheeled) Transfers: Sit to/from Stand Sit to Stand: Min guard;From elevated surface         General transfer comment: Mod verbal cues for sequencing  Ambulation/Gait Ambulation/Gait assistance: Min guard Gait Distance (Feet): 2 Feet Assistive device: Rolling walker (2 wheeled) Gait Pattern/deviations: Step-to pattern Gait velocity: decreased   General Gait Details: Pt steady but with extra effort and cues for sequencing with mostly hop-to gait and occasional light heel WB through the LLE from bed to chair only per MD orders  Stairs            Wheelchair Mobility    Modified Rankin (Stroke Patients Only)       Balance Overall balance assessment: Needs assistance   Sitting balance-Leahy Scale: Normal     Standing balance support: Bilateral upper extremity supported Standing balance-Leahy Scale: Fair                               Pertinent Vitals/Pain Pain Assessment: 0-10  Pain Score: 8  Pain Location: L foot Pain Descriptors / Indicators: Sore;Aching Pain Intervention(s): Premedicated before session;Monitored during session    Home Living Family/patient expects to be discharged to:: Private residence Living Arrangements: Parent;Other relatives Available Help at Discharge: Family;Available 24 hours/day Type of  Home: House Home Access: Stairs to enter Entrance Stairs-Rails: None Entrance Stairs-Number of Steps: 3 Home Layout: One level Home Equipment: Cane - single point      Prior Function Level of Independence: Independent with assistive device(s)         Comments: Mod Ind amb community distances with a SPC, Ind with ADLs, no fall history     Hand Dominance        Extremity/Trunk Assessment   Upper Extremity Assessment Upper Extremity Assessment: Overall WFL for tasks assessed    Lower Extremity Assessment Lower Extremity Assessment: Generalized weakness       Communication   Communication: No difficulties  Cognition Arousal/Alertness: Awake/alert Behavior During Therapy: WFL for tasks assessed/performed Overall Cognitive Status: Within Functional Limits for tasks assessed                                        General Comments      Exercises Total Joint Exercises Ankle Circles/Pumps: AROM;Strengthening;Both;5 reps;10 reps Quad Sets: Strengthening;Both;10 reps;5 reps Gluteal Sets: Strengthening;Both;5 reps;10 reps Hip ABduction/ADduction: Strengthening;Both;10 reps Straight Leg Raises: Strengthening;Both;10 reps Long Arc Quad: Strengthening;Both;10 reps Other Exercises Other Exercises: HEP education for BLE APs, QS, GS, and LAQs x 10 each 5-6x/day   Assessment/Plan    PT Assessment Patient needs continued PT services  PT Problem List Decreased strength;Decreased activity tolerance;Decreased balance;Decreased mobility;Decreased knowledge of use of DME;Decreased safety awareness;Decreased knowledge of precautions;Pain       PT Treatment Interventions DME instruction;Gait training;Stair training;Functional mobility training;Therapeutic activities;Therapeutic exercise;Balance training;Patient/family education    PT Goals (Current goals can be found in the Care Plan section)  Acute Rehab PT Goals Patient Stated Goal: To walk better and get back  home PT Goal Formulation: With patient Time For Goal Achievement: 12/26/19 Potential to Achieve Goals: Good    Frequency 7X/week   Barriers to discharge Inaccessible home environment;Decreased caregiver support      Co-evaluation               AM-PAC PT "6 Clicks" Mobility  Outcome Measure Help needed turning from your back to your side while in a flat bed without using bedrails?: None Help needed moving from lying on your back to sitting on the side of a flat bed without using bedrails?: None Help needed moving to and from a bed to a chair (including a wheelchair)?: A Little Help needed standing up from a chair using your arms (e.g., wheelchair or bedside chair)?: A Little Help needed to walk in hospital room?: Total Help needed climbing 3-5 steps with a railing? : A Lot 6 Click Score: 17    End of Session Equipment Utilized During Treatment: Gait belt Activity Tolerance: Patient tolerated treatment well Patient left: in chair;with call bell/phone within reach;with chair alarm set;with nursing/sitter in room Nurse Communication: Mobility status;Weight bearing status;Precautions PT Visit Diagnosis: Other abnormalities of gait and mobility (R26.89);Muscle weakness (generalized) (M62.81);Pain Pain - Right/Left: Left Pain - part of body: Ankle and joints of foot    Time: 4008-6761 PT Time Calculation (min) (ACUTE ONLY): 47 min   Charges:   PT Evaluation $PT Eval Moderate  Complexity: 1 Mod PT Treatments $Therapeutic Exercise: 8-22 mins        D. Elly Modena PT, DPT 12/13/19, 4:25 PM

## 2019-12-14 ENCOUNTER — Encounter: Payer: Self-pay | Admitting: Cardiology

## 2019-12-14 DIAGNOSIS — M869 Osteomyelitis, unspecified: Secondary | ICD-10-CM

## 2019-12-14 LAB — HEPATITIS C ANTIBODY: HCV Ab: REACTIVE — AB

## 2019-12-14 LAB — BASIC METABOLIC PANEL
Anion gap: 7 (ref 5–15)
BUN: 16 mg/dL (ref 8–23)
CO2: 24 mmol/L (ref 22–32)
Calcium: 7.6 mg/dL — ABNORMAL LOW (ref 8.9–10.3)
Chloride: 106 mmol/L (ref 98–111)
Creatinine, Ser: 0.84 mg/dL (ref 0.61–1.24)
GFR calc Af Amer: >60 mL/min (ref >60)
GFR calc non Af Amer: >60 mL/min (ref >60)
Glucose, Bld: 114 mg/dL — ABNORMAL HIGH (ref 70–99)
Potassium: 4.2 mmol/L (ref 3.5–5.1)
Sodium: 137 mmol/L (ref 135–145)

## 2019-12-14 LAB — GLUCOSE, CAPILLARY
Glucose-Capillary: 102 mg/dL — ABNORMAL HIGH (ref 70–99)
Glucose-Capillary: 178 mg/dL — ABNORMAL HIGH (ref 70–99)
Glucose-Capillary: 173 mg/dL — ABNORMAL HIGH (ref 70–99)
Glucose-Capillary: 119 mg/dL — ABNORMAL HIGH (ref 70–99)

## 2019-12-14 LAB — CBC
HCT: 27.2 % — ABNORMAL LOW (ref 39.0–52.0)
Hemoglobin: 9 g/dL — ABNORMAL LOW (ref 13.0–17.0)
MCH: 29.6 pg (ref 26.0–34.0)
MCHC: 33.1 g/dL (ref 30.0–36.0)
MCV: 89.5 fL (ref 80.0–100.0)
Platelets: 351 10*3/uL (ref 150–400)
RBC: 3.04 MIL/uL — ABNORMAL LOW (ref 4.22–5.81)
RDW: 12.5 % (ref 11.5–15.5)
WBC: 8.5 10*3/uL (ref 4.0–10.5)
nRBC: 0 % (ref 0.0–0.2)

## 2019-12-14 LAB — HCV RNA QUANT: HCV Quantitative: NOT DETECTED IU/mL (ref >50)

## 2019-12-14 LAB — SURGICAL PATHOLOGY

## 2019-12-14 NOTE — Progress Notes (Addendum)
PROGRESS NOTE    Edward Richard  ZOX:[id number] DOB: [birth year] DOA: 12/10/2019 PCP: Preston Fleetingevelo, Adrian Mancheno, MD   Chief Complaint  Patient presents with  . Wound Infection    Brief Narrative:  64 year old male with uncontrolled type 2 diabetes mellitus with peripheral neuropathy, hypertension, hyperlipidemia, ongoing tobacco use presented with  osteomyelitis and gas gangrene of the left fifth metatarsal head and fifth proximal phalanx and cellulitis of dorsal lateral forefoot. 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  Assessment & Plan: Principal problem Diabetic osteomyelitis of the left foot. 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  Recommend partial weightbearing with heel contact in a surgical shoe.  PT recommends SNF but patient refuses and wants to go home).  Ordered for rolling walker, 3 and 1, wheelchair and home health nursing if needed. Pain better on oxycodone and low-dose IV Dilaudid. . Wound culture growing MRSA and Streptococcus Gallolyticus.  On empiric IV vancomycin.   Vascular surgery consult appreciated.  Aortogram done without significant peripheral vascular disease.  Active Problems:  Diabetic ulcer of left foot associated with uncontrolled type 2 diabetes mellitus (HCC) Continue empiric antibiotics.  A1c of 9.2.  Increase Lantus to 20 units daily and add Premeal aspart 4 units 3 times daily.  Needs tight blood glucose control.  Continue gabapentin for neuropathy.  MRSA bacteremia Positive in 1/2 blood culture.  Resistant to oxacillin.  Planned for PICC line with IV antibiotic for 2-4 weeks but given concern for IVDU, podiatry to review wound today and recommend if it is healing well.  ID will then decide on weekly dalbavancin or linezolid.  Repeat blood cultures negative.  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  Denies IV drug use (recent or remote history.  Seems unreliable).  Follow HCV  RNA.  Hyperlipidemia Continue statin.  Ongoing tobacco use Counseled on cessation.  Nicotine patch.  Essential hypertension Continue amlodipine and metoprolol  Postoperative acute blood loss anemia Hemoglobin dropped was 8.8 from baseline 13.  Iron panel suggest iron deficiency.  Stool for Hemoccult pending.  We will add iron supplement upon discharge.  DVT prophylaxis: Subcu Lovenox Code Status: Full code Family Communication: None Disposition:   Status is: Inpatient  Remains inpatient appropriate because:Ongoing active pain requiring inpatient pain management.  Determine outpatient antibiotic regimen after ID and podiatry evaluation.Marland Kitchen.  Possible discharge home tomorrow if pain better controlled and antibiotic regimen determined.   Dispo: The patient is from: Home              Anticipated d/c is to: Home              Anticipated d/c date is: 1 day              Patient currently is not medically stable to d/c.       Consultants:   Podiatry, vascular surgery   Procedures:  Ray amputation left foot  Aortogram   Antimicrobials: IV vancomycin and Unasyn   Subjective: Seen and examined.  Foot pain better and was able to participate with therapy.  Objective: Vitals:   12/13/19 1404 12/13/19 1547 12/13/19 2209 12/14/19 0738  BP: 135/65 139/62 (!) 151/75 (!) 159/82  Pulse: 67 71 70 72  Resp: 17 16 17 20   Temp: 98.6 F (37 C) 98.2 F (36.8 C) 98.3 F (36.8 C) 98.4 F (36.9 C)  TempSrc: Oral Oral Oral Oral  SpO2: 100% 98% 96% 94%  Weight:  Height:        Intake/Output Summary (Last 24 hours) at 12/14/2019 1350 Last data filed at 12/14/2019 0600 Gross per 24 hour  Intake 894.61 ml  Output 550 ml  Net 344.61 ml   Filed Weights   12/10/19 1209 12/11/19 0000 12/11/19 1334  Weight: 79.4 kg 83.4 kg 83.4 kg   Physical exam Not in distress HEENT: Mucosa Chest: Clear CVs: Normal S1-S2 GI: Soft, nondistended, nontender Musculoskeletal: Dressing over  left foot with postop shoe.    Data Reviewed: I have personally reviewed following labs and imaging studies  CBC: Recent Labs  Lab 12/10/19 1213 12/11/19 0530 12/12/19 0550 12/13/19 0000 12/14/19 0447  WBC 13.5* 8.8 8.2 9.1 8.5  NEUTROABS 10.9*  --   --   --   --   HGB 11.5* 10.0* 9.7* 8.8* 9.0*  HCT 34.4* 30.6* 29.4* 26.3* 27.2*  MCV 88.2 89.7 89.1 88.0 89.5  PLT 357 294 301 314 351    Basic Metabolic Panel: Recent Labs  Lab 12/10/19 1213 12/11/19 0530 12/12/19 0550 12/13/19 0930 12/14/19 0447  NA 134* 134* 136  --  137  K 3.9 4.4 4.5  --  4.2  CL 101 103 107  --  106  CO2 22 25 25   --  24  GLUCOSE 217* 250* 318*  --  114*  BUN 17 20 19   --  16  CREATININE 1.08 1.07 0.87 0.94 0.84  CALCIUM 8.5* 7.8* 7.7*  --  7.6*  MG  --   --  2.3  --   --     GFR: Estimated Creatinine Clearance: 90 mL/min (by C-G formula based on SCr of 0.84 mg/dL).  Liver Function Tests: Recent Labs  Lab 12/10/19 1213  AST 15  ALT 9  ALKPHOS 70  BILITOT 0.9  PROT 8.6*  ALBUMIN 3.5    CBG: Recent Labs  Lab 12/13/19 1408 12/13/19 1618 12/13/19 2105 12/14/19 0741 12/14/19 1137  GLUCAP 103* 142* 165* 102* 178*     Recent Results (from the past 240 hour(s))  Culture, blood (Routine x 2)     Status: Abnormal   Collection Time: 12/10/19 12:13 PM   Specimen: BLOOD  Result Value Ref Range Status   Specimen Description   Final    BLOOD RIGHT ANTECUBITAL Performed at [facility], [address], [place], [address] [place]    Special Requests   Final    BOTTLES DRAWN AEROBIC AND ANAEROBIC Blood Culture results may not be optimal due to an excessive volume of blood received in culture bottles Performed at [facility], [address] [place], [address] [place]    Culture  Setup Time   Final    GRAM POSITIVE COCCI ANAEROBIC BOTTLE ONLY CRITICAL RESULT CALLED TO, READ BACK BY AND VERIFIED WITH: JASON ROBBINS 12/11/19 1647 KLW Performed at [facility], [address] [place], [address] [place]    Culture METHICILLIN RESISTANT STAPHYLOCOCCUS AUREUS (A)  Final   Report Status 12/13/2019 FINAL  Final   Organism ID, Bacteria METHICILLIN RESISTANT STAPHYLOCOCCUS AUREUS  Final      Susceptibility   Methicillin resistant staphylococcus aureus - MIC*    CIPROFLOXACIN <=0.5 SENSITIVE Sensitive     ERYTHROMYCIN >=8 RESISTANT Resistant     GENTAMICIN <=0.5 SENSITIVE Sensitive     OXACILLIN >=4 RESISTANT Resistant     TETRACYCLINE <=1 SENSITIVE Sensitive     VANCOMYCIN 1 SENSITIVE Sensitive     TRIMETH/SULFA <=10 SENSITIVE Sensitive  CLINDAMYCIN <=0.25 SENSITIVE Sensitive     RIFAMPIN <=0.5 SENSITIVE Sensitive     Inducible Clindamycin NEGATIVE Sensitive     * METHICILLIN RESISTANT STAPHYLOCOCCUS AUREUS  Blood Culture ID Panel (Reflexed)     Status: Abnormal   Collection Time: 12/10/19 12:13 PM  Result Value Ref Range Status   Enterococcus species NOT DETECTED NOT DETECTED Final   Listeria monocytogenes NOT DETECTED NOT DETECTED Final   Staphylococcus species DETECTED (A) NOT DETECTED Corrected    Comment: CRITICAL RESULT CALLED TO, READ BACK BY AND VERIFIED WITH: JASON ROBBINS 12/11/19 1647 KLW CORRECTED ON 06/09 AT 0853: PREVIOUSLY REPORTED AS DETECTED CRITICAL RESULT CALLED TO, READ BACK BY AND VERIFIED WITH: JASON ROBBINS 12/11/19 KLW    Staphylococcus aureus (BCID) DETECTED (A) NOT DETECTED Corrected    Comment: Methicillin (oxacillin)-resistant Staphylococcus aureus (MRSA). MRSA is predictably resistant to beta-lactam antibiotics (except ceftaroline). Preferred therapy is vancomycin unless clinically contraindicated. Patient requires contact precautions if  hospitalized. CRITICAL RESULT CALLED TO, READ BACK BY AND VERIFIED WITH: JASON ROBBINS 12/11/19 1647 KLW CORRECTED ON 06/09 AT 0853: PREVIOUSLY REPORTED AS DETECTED Methicillin (oxacillin) resistant Staphylococcus aureus (MRSA). MRSA is predictably resistant to beta lactam  antibiotics (except ceftaroline). Preferred therapy is vancomycin unless clinically  contraindicated. Patient requires contact precautions if hospitalized. CRITICAL RESULT CALLED TO, READ BACK BY AND VERIFIED WITH: JASON ROBBINS 12/11/19 KLW    Methicillin resistance DETECTED (A) NOT DETECTED Final    Comment: CRITICAL RESULT CALLED TO, READ BACK BY AND VERIFIED WITH: JASON ROBBINS 12/11/19 1647 KLW    Streptococcus species NOT DETECTED NOT DETECTED Final   Streptococcus agalactiae NOT DETECTED NOT DETECTED Final   Streptococcus pneumoniae NOT DETECTED NOT DETECTED Final   Streptococcus pyogenes NOT DETECTED NOT DETECTED Final   Acinetobacter baumannii NOT DETECTED NOT DETECTED Final   Enterobacteriaceae species NOT DETECTED NOT DETECTED Final   Enterobacter cloacae complex NOT DETECTED NOT DETECTED Final   Escherichia coli NOT DETECTED NOT DETECTED Final   Klebsiella oxytoca NOT DETECTED NOT DETECTED Final   Klebsiella pneumoniae NOT DETECTED NOT DETECTED Final   Proteus species NOT DETECTED NOT DETECTED Final   Serratia marcescens NOT DETECTED NOT DETECTED Final   Haemophilus influenzae NOT DETECTED NOT DETECTED Final   Neisseria meningitidis NOT DETECTED NOT DETECTED Final   Pseudomonas aeruginosa NOT DETECTED NOT DETECTED Final   Candida albicans NOT DETECTED NOT DETECTED Final   Candida glabrata NOT DETECTED NOT DETECTED Final   Candida krusei NOT DETECTED NOT DETECTED Final   Candida parapsilosis NOT DETECTED NOT DETECTED Final   Candida tropicalis NOT DETECTED NOT DETECTED Final    Comment: Performed at [facility], [address] [place], [place] [address]  Aerobic/Anaerobic Culture (surgical/deep wound)     Status: None (Preliminary result)   Collection Time: 12/10/19  7:26 PM   Specimen: Wound  Result Value Ref Range Status   Specimen Description   Final    WOUND Performed at [facility], [address], [place], [place] [address]    Special Requests    Final    NONE Performed at [facility], [address] [place], [place] [address]    Gram Stain   Final    NO WBC SEEN ABUNDANT GRAM POSITIVE COCCI ABUNDANT GRAM NEGATIVE RODS    Culture   Final    CULTURE REINCUBATED FOR BETTER GROWTH HOLDING FOR POSSIBLE ANAEROBE Performed at [facility], [address] [place], [place] [address]    Report Status  PENDING  Incomplete  SARS Coronavirus 2 by RT PCR (hospital order, performed in [facility] hospital lab) Nasopharyngeal Nasopharyngeal Swab     Status: None   Collection Time: 12/10/19 10:16 PM   Specimen: Nasopharyngeal Swab  Result Value Ref Range Status   SARS Coronavirus 2 NEGATIVE NEGATIVE Final    Comment: (NOTE) SARS-CoV-2 target nucleic acids are NOT DETECTED. The SARS-CoV-2 RNA is generally detectable in upper and lower respiratory specimens during the acute phase of infection. The lowest concentration of SARS-CoV-2 viral copies this assay can detect is 250 copies / mL. A negative result does not preclude SARS-CoV-2 infection and should not be used as the sole basis for treatment or other patient management decisions.  A negative result may occur with improper specimen collection / handling, submission of specimen other than nasopharyngeal swab, presence of viral mutation(s) within the areas targeted by this assay, and inadequate number of viral copies (<250 copies / mL). A negative result must be combined with clinical observations, patient history, and epidemiological information. Fact Sheet for Patients:   BoilerBrush.com.cy Fact Sheet for Healthcare Providers: https://pope.com/ This test is not yet approved or cleared  by the [place] FDA and has been authorized for detection and/or diagnosis of SARS-CoV-2 by FDA under an Emergency Use Authorization (EUA).  This EUA will remain in effect (meaning this test can be used) for the duration of  the COVID-19 declaration under Section 564(b)(1) of the Act, 21 U.S.C. section 360bbb-3(b)(1), unless the authorization is terminated or revoked sooner. Performed at [facility], [address], [place], [place] [address]   Surgical pcr screen     Status: None   Collection Time: 12/11/19 12:11 AM   Specimen: Nasal Mucosa; Nasal Swab  Result Value Ref Range Status   MRSA, PCR NEGATIVE NEGATIVE Final   Staphylococcus aureus NEGATIVE NEGATIVE Final    Comment: (NOTE) The Xpert SA Assay (FDA approved for NASAL specimens in patients 47 years of age and older), is one component of a comprehensive surveillance program. It is not intended to diagnose infection nor to guide or monitor treatment. Performed at [facility], [address] [place], [place] [address]   Aerobic/Anaerobic Culture (surgical/deep wound)     Status: None (Preliminary result)   Collection Time: 12/11/19  5:37 PM   Specimen: PATH Digit amputation; Tissue  Result Value Ref Range Status   Specimen Description   Final    WOUND LEFT 5TH DIGIT Performed at [facility], [address] [place], [place] [address]    Special Requests   Final    NONE Performed at [facility], [address] [place], [place] [address]    Gram Stain   Final    FEW WBC PRESENT, PREDOMINANTLY PMN FEW GRAM POSITIVE COCCI RARE GRAM NEGATIVE RODS Performed at [facility], [address], [place], [place] [address]    Culture   Final    RARE METHICILLIN RESISTANT STAPHYLOCOCCUS AUREUS FEW STREPTOCOCCUS GALLOLYTICUS NO ANAEROBES ISOLATED; CULTURE IN PROGRESS FOR 5 DAYS    Report Status PENDING  Incomplete   Organism ID, Bacteria METHICILLIN RESISTANT STAPHYLOCOCCUS AUREUS  Final   Organism ID, Bacteria STREPTOCOCCUS GALLOLYTICUS  Final      Susceptibility   Methicillin resistant staphylococcus aureus - MIC*    CIPROFLOXACIN <=0.5 SENSITIVE Sensitive     ERYTHROMYCIN >=8 RESISTANT  Resistant     GENTAMICIN <=0.5 SENSITIVE Sensitive     OXACILLIN >=4 RESISTANT Resistant  TETRACYCLINE <=1 SENSITIVE Sensitive     VANCOMYCIN 1 SENSITIVE Sensitive     TRIMETH/SULFA <=10 SENSITIVE Sensitive     CLINDAMYCIN <=0.25 SENSITIVE Sensitive     RIFAMPIN <=0.5 SENSITIVE Sensitive     Inducible Clindamycin NEGATIVE Sensitive     * RARE METHICILLIN RESISTANT STAPHYLOCOCCUS AUREUS   Streptococcus gallolyticus - MIC*    PENICILLIN 0.12 SENSITIVE Sensitive     CEFTRIAXONE <=0.12 SENSITIVE Sensitive     ERYTHROMYCIN <=0.12 SENSITIVE Sensitive     LEVOFLOXACIN 2 SENSITIVE Sensitive     VANCOMYCIN <=0.12 SENSITIVE Sensitive     * FEW STREPTOCOCCUS GALLOLYTICUS  CULTURE, BLOOD (ROUTINE X 2) w Reflex to ID Panel     Status: None (Preliminary result)   Collection Time: 12/13/19 12:00 AM   Specimen: BLOOD  Result Value Ref Range Status   Specimen Description BLOOD RAC  Final   Special Requests BOTTLES DRAWN AEROBIC AND ANAEROBIC BCAV  Final   Culture   Final    NO GROWTH 1 DAY Performed at [facility], [address] [place], [place] [address]    Report Status PENDING  Incomplete  CULTURE, BLOOD (ROUTINE X 2) w Reflex to ID Panel     Status: None (Preliminary result)   Collection Time: 12/13/19 12:11 AM   Specimen: BLOOD  Result Value Ref Range Status   Specimen Description BLOOD RH  Final   Special Requests BOTTLES DRAWN AEROBIC AND ANAEROBIC BCAV  Final   Culture   Final    NO GROWTH 1 DAY Performed at [facility], [address], [place], [place] [address]    Report Status PENDING  Incomplete         Radiology Studies: ECHO TEE  Result Date: 12/14/2019    TRANSESOPHOGEAL ECHO REPORT   Patient Name:   KATSUMI WISLER Date of Exam: 12/13/2019 Medical Rec #:  [id number]        Height:       69.0 in Accession #:    [id number]       Weight:       183.9 lb Date of Birth:  24-Apr-1956        BSA:          1.993 m Patient Age:    64 years         BP:            126/63 mmHg Patient Gender: M                HR:           66 bpm. Exam Location:  [facility] Procedure: Transesophageal Echo, Cardiac Doppler, Color Doppler and Saline            Contrast Bubble Study Indications:     No Diagnosis listed on order  History:         Patient has no prior history of Echocardiogram examinations.                  Previous Myocardial Infarction, COPD; Risk Factors:Hypertension                  and Diabetes.  Sonographer:     Sherrie Sport RDCS (AE) Referring Phys:  [place] Diagnosing Phys: Bartholome Bill MD PROCEDURE: After discussion of the risks and benefits of a TEE, an informed consent was obtained from the patient. TEE procedure time was 13 minutes. The transesophogeal probe was passed without difficulty through the esophogus  of the patient. Imaged were obtained with the patient in a left lateral decubitus position. Local oropharyngeal anesthetic was provided with viscous lidocaine and Benzocaine spray. Sedation performed by performing physician. Image quality was excellent. The patient's vital signs; including heart rate, blood pressure, and oxygen saturation; remained stable throughout the procedure. The patient developed no complications during the procedure. IMPRESSIONS  1. Left ventricular ejection fraction, by estimation, is 60 to 65%. The left ventricle has normal function. The left ventricle has no regional wall motion abnormalities.  2. Right ventricular systolic function is normal. The right ventricular size is normal.  3. No left atrial/left atrial appendage thrombus was detected.  4. The mitral valve is normal in structure. Trivial mitral valve regurgitation.  5. The aortic valve is tricuspid. Aortic valve regurgitation is not visualized. FINDINGS  Left Ventricle: Left ventricular ejection fraction, by estimation, is 60 to 65%. The left ventricle has normal function. The left ventricle has no regional wall motion abnormalities. The left ventricular internal  cavity size was normal in size. There is  no left ventricular hypertrophy. Right Ventricle: The right ventricular size is normal. No increase in right ventricular wall thickness. Right ventricular systolic function is normal. Left Atrium: Left atrial size was normal in size. No left atrial/left atrial appendage thrombus was detected. Right Atrium: Right atrial size was normal in size. Pericardium: There is no evidence of pericardial effusion. Mitral Valve: The mitral valve is normal in structure. Trivial mitral valve regurgitation. Tricuspid Valve: The tricuspid valve is normal in structure. Tricuspid valve regurgitation is mild. Aortic Valve: The aortic valve is tricuspid. Aortic valve regurgitation is not visualized. Pulmonic Valve: The pulmonic valve was grossly normal. Pulmonic valve regurgitation is trivial. Aorta: The aortic root is normal in size and structure. IAS/Shunts: No atrial level shunt detected by color flow Doppler. Agitated saline contrast was given intravenously to evaluate for intracardiac shunting. Harold Hedge MD Electronically signed by Harold Hedge MD Signature Date/Time: 12/14/2019/8:14:44 AM    Final         Scheduled Meds: . amLODipine  10 mg Oral Daily  . atorvastatin  20 mg Oral QPM  . DULoxetine  30 mg Oral Daily  . enoxaparin (LOVENOX) injection  40 mg Subcutaneous Q24H  . gabapentin  800 mg Oral TID  . insulin aspart  0-15 Units Subcutaneous TID WC  . insulin aspart  4 Units Subcutaneous TID WC  . insulin glargine  20 Units Subcutaneous Daily  . metoprolol tartrate  25 mg Oral BID  . nicotine  14 mg Transdermal Daily  . polyethylene glycol  17 g Oral Daily  . sodium chloride flush  3 mL Intravenous Q12H  . sodium chloride flush  3 mL Intravenous Q12H   Continuous Infusions: . sodium chloride 250 mL (12/14/19 1004)  . ampicillin-sulbactam (UNASYN) IV 3 g (12/14/19 1157)  . vancomycin 1,250 mg (12/14/19 1006)     LOS: 4 days    Time spent: 25  minutes    Manasseh Pittsley, MD [facility]   To contact the attending provider between 7A-7P or the covering provider during after hours 7P-7A, please log into the web site [email or web address] and access using universal [place] password for that web site. If you do not have the password, please call the hospital operator.  12/14/2019, 1:50 PM

## 2019-12-14 NOTE — TOC Progression Note (Signed)
Transition of Care [facility]) - Progression Note    Patient Details  Name: Edward Richard MRN: [id number] Date of Birth: 1956/03/08  Transition of Care [facility]) CM/SW Contact  Barrie Dunker, RN Phone Number: 12/14/2019, 12:57 PM  Clinical Narrative:     [name] a Rolling walker into the room provided by [facility] charity   Expected Discharge Plan: Home w Home Health Services Barriers to Discharge: Continued Medical Work up  Expected Discharge Plan and Services Expected Discharge Plan: Home w Home Health Services   Discharge Planning Services: CM Consult   Living arrangements for the past 2 months: Skilled Nursing Facility, Single Family Home                 DME Arranged: N/A         HH Arranged: PT, RN   Date HH Agency Contacted: 12/12/19 Time HH Agency Contacted: 1456 Representative spoke with at [facility] Agency: [place]   Social Determinants of Health (SDOH) Interventions    Readmission Risk Interventions No flowsheet data found.

## 2019-12-14 NOTE — Progress Notes (Signed)
Physical Therapy Treatment Patient Details Name: Edward Richard MRN: [id number] DOB: 1955-09-24 Today's Date: 12/14/2019    History of Present Illness Pt is a 64 y/o M with PMH that includes DM2, peripheral neuropathy, HLD, HTN who presents w/ left foot wound x 5 days.  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  MD assessment also includes: MRSA bacteremia, HLD, HTN, and postoperative acute blood loss anemia.    PT Comments    Pt motivated to participate during the session and eager to return home.  Pt is somewhat impulsive at times and frequently required cues for general sequencing during the session to ensure WB compliance.  Pt was steady with transfers and demonstrated good control during stair training with no LOB or buckling.  Pt made good progress towards goals but is at high risk for non-compliance with WB status without skilled supervision.  Pt will benefit from PT services in a SNF setting upon discharge to safely address deficits listed in patient problem list for decreased caregiver assistance and eventual return to PLOF.     Follow Up Recommendations  SNF;Supervision for mobility/OOB     Equipment Recommendations  Rolling walker with 5" wheels;3in1 (PT);Wheelchair (measurements PT);Other (comment)    Recommendations for Other Services       Precautions / Restrictions Precautions Precautions: Fall Required Braces or Orthoses: Other Brace Other Brace: Surgical shoe with WB Restrictions Weight Bearing Restrictions: Yes LLE Weight Bearing: Partial weight bearing LLE Partial Weight Bearing Percentage or Pounds: Per Dr. Excell Seltzer, [facility] through the L heel only with surgical shoe donned for transfers only, avoid ambulation and prolonged standing. OK to practice stair training  with minimal heel WB.    Mobility  Bed Mobility Overal bed mobility: Modified Independent             General bed mobility comments: Extra time and effort only  Transfers Overall transfer level: Needs assistance Equipment used: Rolling walker (2 wheeled) Transfers: Sit to/from Stand Sit to Stand: Min guard;From elevated surface         General transfer comment: Mod verbal cues for sequencing  Ambulation/Gait Ambulation/Gait assistance: Min guard Gait Distance (Feet): 2 Feet Assistive device: Rolling walker (2 wheeled) Gait Pattern/deviations: Step-to pattern Gait velocity: decreased   General Gait Details: Pt steady but with extra effort and cues for sequencing with mostly hop-to gait and occasional light heel WB through the LLE from bed to chair only per MD orders   Stairs Stairs: Yes Stairs assistance: Min guard Stair Management: With walker;Backwards;Forwards Number of Stairs: 3 General stair comments: Verbal and visual education for proper sequencing followed by pt practice with mod verbal cues for sequencing, pt steady with good control and stability   Wheelchair Mobility    Modified Rankin (Stroke Patients Only)       Balance Overall balance assessment: Needs assistance   Sitting balance-Leahy Scale: Normal     Standing balance support: Bilateral upper extremity supported Standing balance-Leahy Scale: Fair                              Cognition Arousal/Alertness: Awake/alert Behavior During Therapy: WFL for tasks assessed/performed;Impulsive Overall Cognitive Status: Within Functional Limits for tasks assessed  Exercises Total Joint Exercises Ankle Circles/Pumps: AROM;Strengthening;Both;10 reps Quad Sets: Strengthening;Both;10 reps Gluteal Sets: Strengthening;Both;10 reps Hip ABduction/ADduction: Strengthening;Both;10 reps Straight Leg Raises: Strengthening;Both;10  reps Long Arc Quad: Strengthening;Both;10 reps Other Exercises Other Exercises: HEP education review for BLE APs, QS, GS, and LAQs x 10 each 5-6x/day Other Exercises: Pt education and review for WB status/precautions and surgical shoe donning Other Exercises: Sit to/from stand transfer training from various height surfaces    General Comments        Pertinent Vitals/Pain Pain Assessment: 0-10 Pain Score: 6  Pain Location: L foot Pain Descriptors / Indicators: Sore;Aching Pain Intervention(s): Premedicated before session;Monitored during session    Home Living                      Prior Function            PT Goals (current goals can now be found in the care plan section) Progress towards PT goals: Progressing toward goals    Frequency    7X/week      PT Plan Current plan remains appropriate    Co-evaluation              AM-PAC PT "6 Clicks" Mobility   Outcome Measure  Help needed turning from your back to your side while in a flat bed without using bedrails?: None Help needed moving from lying on your back to sitting on the side of a flat bed without using bedrails?: None Help needed moving to and from a bed to a chair (including a wheelchair)?: A Little Help needed standing up from a chair using your arms (e.g., wheelchair or bedside chair)?: A Little Help needed to walk in hospital room?: A Little Help needed climbing 3-5 steps with a railing? : A Little 6 Click Score: 20    End of Session Equipment Utilized During Treatment: Gait belt Activity Tolerance: Patient tolerated treatment well Patient left: in chair;with call bell/phone within reach;with chair alarm set;with nursing/sitter in room Nurse Communication: Mobility status;Weight bearing status;Precautions PT Visit Diagnosis: Other abnormalities of gait and mobility (R26.89);Muscle weakness (generalized) (M62.81);Pain Pain - Right/Left: Left Pain - part of body: Ankle and joints of foot      Time: 1610-9604 PT Time Calculation (min) (ACUTE ONLY): 38 min  Charges:  $Gait Training: 8-22 mins $Therapeutic Exercise: 8-22 mins $Therapeutic Activity: 8-22 mins                    D. Scott Clessie Karras PT, DPT 12/14/19, 10:42 AM

## 2019-12-14 NOTE — TOC Progression Note (Signed)
Transition of Care [facility]) - Progression Note    Patient Details  Name: Arvind Mexicano MRN: [id number] Date of Birth: 1955-12-01  Transition of Care [facility]) CM/SW Contact  Barrie Dunker, RN Phone Number: 12/14/2019, 9:56 AM  Clinical Narrative:    The patient only has a cane at home and will need a RW and a wheelchair, he is non weight bearing on one foot and has stairs going into the home, I was not able to locate any Home health agency to do PT, Medicaid will only approve 3 visits per year and that includes evaluation, 1 treatment and Discharge, I contacted the physician asking if he would like me to set up with Outpatient Physical therapy, the patient has his sister and aunt to help him at home and I will be getting a wheelchair from [employer], Trying to get a Rolling walker thru charity, I contacted Zack with [employer] and the rental fee for a ramp is $55 for a 5 foot ramp and $75 for a 7 foot ramp, Once the patient return to the room from working with PT I will ask him about paying for a ramp so he will be able to leave his home with the stairs and the wheelchair   Expected Discharge Plan: Home w Home Health Services Barriers to Discharge: Continued Medical Work up  Expected Discharge Plan and Services Expected Discharge Plan: Home w Home Health Services   Discharge Planning Services: CM Consult   Living arrangements for the past 2 months: Skilled Nursing Facility, Single Family Home                 DME Arranged: N/A         HH Arranged: PT, RN   Date HH Agency Contacted: 12/12/19 Time HH Agency Contacted: 1456 Representative spoke with at [facility] Agency: [place]   Social Determinants of Health (SDOH) Interventions    Readmission Risk Interventions No flowsheet data found.

## 2019-12-14 NOTE — TOC Progression Note (Signed)
Transition of Care [facility]) - Progression Note    Patient Details  Name: Edward Richard MRN: [id number] Date of Birth: 05-12-1956  Transition of Care [facility]) CM/SW [place], RN Phone Number: 12/14/2019, 10:50 AM  Clinical Narrative:    Met with the patient to discuss Outpatient PT and ramp He is agreeable to go to Outpatient PT, Referral on chart for physician to sign, Will fax once signed, He stated that he does not want or need a Wheelchair ramp that he has 4-5 individuals to help him get up and down the stairs, He stated that his main goal is to go home, I explained that they are still treating him for the infection and he will get to DC once medically stable, he is agreeable   Expected Discharge Plan: [place] Barriers to Discharge: Continued Medical Work up  Expected Discharge Plan and Services Expected Discharge Plan: [place]   Discharge Planning Services: CM Consult   Living arrangements for the past 2 months: [place], Single Family Home                 DME Arranged: N/A         HH Arranged: PT, RN   Date HH Agency Contacted: 12/12/19 Time [place]: [id number] Representative spoke with at [place]: [place] ([place]) Interventions    Readmission Risk Interventions No flowsheet data found.

## 2019-12-14 NOTE — TOC Progression Note (Signed)
Transition of Care [facility]) - Progression Note    Patient Details  Name: Edward Richard MRN: [id number] Date of Birth: [birth year]  Transition of Care [facility]) CM/SW Contact  Barrie Dunker, RN Phone Number: 12/14/2019, 12:01 PM  Clinical Narrative:    Faxed outpatient PT referral to [phone]  Expected Discharge Plan: Home w Home Health Services Barriers to Discharge: Continued Medical Work up  Expected Discharge Plan and Services Expected Discharge Plan: Home w Home Health Services   Discharge Planning Services: CM Consult   Living arrangements for the past 2 months: Skilled Nursing Facility, Single Family Home                 DME Arranged: N/A         HH Arranged: PT, RN   Date HH Agency Contacted: 12/12/19 Time HH Agency Contacted: [phone] Representative spoke with at [facility] Agency: [place]   Social Determinants of Health (SDOH) Interventions    Readmission Risk Interventions No flowsheet data found.

## 2019-12-14 NOTE — Progress Notes (Cosign Needed)
Patient suffers from amputation and non weight bearing which impairs their ability to perform daily activities like ADLs in the home.  A walking aid will not resolve issue with performing activities of daily living. A wheelchair will allow patient to safely perform daily activities. Patient is not able to propel themselves in the home using a standard weight wheelchair due to weakness. Patient can self propel in the lightweight wheelchair. Length of need 99 months Accessories: elevating leg rests back cushion wheel locks, extensions and anti-tippers.

## 2019-12-14 NOTE — Progress Notes (Signed)
ID  Pt doing well Patient Vitals for the past 24 hrs:  BP Temp Temp src Pulse Resp SpO2  12/14/19 1545 130/68 98.8 F (37.1 C) Oral 64 14 97 %  12/14/19 0738 (!) 159/82 98.4 F (36.9 C) Oral 72 20 94 %  12/13/19 2209 (!) 151/75 98.3 F (36.8 C) Oral 70 17 96 %   O/e no distress Chest cta Hss1s2 abd soft Left foot surgical site-some oozing because of vanco beads      CMP Latest Ref Rng & Units 12/14/2019 12/13/2019 12/12/2019  Glucose 70 - 99 mg/dL 562(Z) - 308(M)  BUN 8 - 23 mg/dL 16 - 19  Creatinine 5.78 - 1.24 mg/dL 4.69 6.29 5.28  Sodium 135 - 145 mmol/L 137 - 136  Potassium 3.5 - 5.1 mmol/L 4.2 - 4.5  Chloride 98 - 111 mmol/L 106 - 107  CO2 22 - 32 mmol/L 24 - 25  Calcium 8.9 - 10.3 mg/dL 7.6(L) - 7.7(L)  Total Protein 6.5 - 8.1 g/dL - - -  Total Bilirubin 0.3 - 1.2 mg/dL - - -  Alkaline Phos 38 - 126 U/L - - -  AST 15 - 41 U/L - - -  ALT 0 - 44 U/L - - -    CBC Latest Ref Rng & Units 12/14/2019 12/13/2019 12/12/2019  WBC 4.0 - 10.5 K/uL 8.5 9.1 8.2  Hemoglobin 13.0 - 17.0 g/dL 9.0(L) 8.8(L) 9.7(L)  Hematocrit 39 - 52 % 27.2(L) 26.3(L) 29.4(L)  Platelets 150 - 400 K/uL 351 314 301   Micro 6/7 1 of 2 in anerobic bottle MRSA 6/8 Bone culture MRSA/Streptococcus  6/10 BC- NG Pathology  ACUTE OSTEOMYELITIS.  - PROXIMAL RESECTION MARGIN IS NEGATIVE FOR ACTIVE INFLAMMATION.    Impression/recommendation Necrotic infection with gas gangrene of the left fifth toe with diabetes and diabetic neuropathy Osteomyelitis of the left fifth toe- proximal resected margin clear of osteo. 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 Today he said he wants antibiotic treatment at home  to be "easy, simple and pain free" today is day 5 of IV vanco The options are to continue with IV vancomycin for 4 weeks or do oritavancin a long acting glycopeptide once in 7-10 days for 4 doses Need to discuss with his sister whom he says will do his IV medicine to see how reliable they would be in administering IV.    Diabetes mellitus on Lantus  Coronary artery disease status post stent on Plavix, Hypertension on metoprolol, and amlodipine Hyperlipidemia on simvastatin ? ___________________________________________________  Discussed the management with the patient and care team

## 2019-12-14 NOTE — Progress Notes (Addendum)
PODIATRY / FOOT AND ANKLE SURGERY PROGRESS NOTE  Requesting Physician: Fabienne BrunsJamiese Williams, MD  Reason for consult: L foot wound  Chief Complaint: Left foot wound   HPI: Edward Richard is a 64 y.o. male who presents resting in bed comfortably status post 3d left partial fifth ray amputation.  Patient states that he does have some pain to the area but states that it does not feel any different than what it did preoperatively.  He states that he has been putting weight on the foot with heel contact only in the surgical shoe.  He has worked with physical therapy on this and has been told to try to stay off the foot as much possible.  Patient thinks he is doing much better than he was prior to coming in the hospital.  PMHx:  Past Medical History:  Diagnosis Date   COPD (chronic obstructive pulmonary disease) (HCC)    Diabetes mellitus without complication (HCC)    Hypercholesteremia    Hypertension    Myocardial infarction [facility](HCC)     Surgical Hx:  Past Surgical History:  Procedure Laterality Date   AMPUTATION Left 12/11/2019   Procedure: AMPUTATION RAY 5TH RAY PARTIAL;  Surgeon: Rosetta PosnerBaker, Berry Godsey, DPM;  Location: [facility] ORS;  Service: Podiatry;  Laterality: Left;   INCISION AND DRAINAGE Left 12/11/2019   Procedure: INCISION AND DRAINAGE;  Surgeon: Rosetta PosnerBaker, Libia Fazzini, DPM;  Location: [facility] OR[facility];  Service: Podiatry;  Laterality: Left;   LOWER EXTREMITY ANGIOGRAPHY Left 12/11/2019   Procedure: Lower Extremity Angiography;  Surgeon: Renford DillsSchnier, Gregory G, MD;  Location: [facility] INVASIVE CV LAB;  Service: Cardiovascular;  Laterality: Left;   NO PAST SURGERIES     TEE WITHOUT CARDIOVERSION N/A 12/13/2019   Procedure: TRANSESOPHAGEAL ECHOCARDIOGRAM (TEE);  Surgeon: Dalia HeadingFath, Kenneth A, MD;  Location: [facility] ORS;  Service: Cardiovascular;  Laterality: N/A;    FHx:  Family History  Problem Relation Age of Onset   Cancer Mother    Heart failure Father    Cancer Father     Social History:  reports that he has  been smoking cigarettes. He has been smoking about 0.50 packs per day. He has never used smokeless tobacco. He reports current alcohol use. He reports that he does not use drugs.  Allergies: No Known Allergies  Review of Systems: General ROS: positive for  - fatigue Respiratory ROS: no cough, shortness of breath, or wheezing Cardiovascular ROS: no chest pain or dyspnea on exertion Gastrointestinal ROS: no abdominal pain, change in bowel habits, or black or bloody stools Musculoskeletal ROS: positive for - joint pain, joint stiffness and joint swelling Neurological ROS: positive for - numbness/tingling Dermatological ROS: positive for Left partial fifth ray amputation site sutures intact  Medications Prior to Admission  Medication Sig Dispense Refill   amLODipine (NORVASC) 10 MG tablet Take 10 mg by mouth daily.     dapagliflozin propanediol (FARXIGA) 10 MG TABS tablet Take 10 mg by mouth daily.     DULoxetine (CYMBALTA) 30 MG capsule Take 30 mg by mouth daily.     gabapentin (NEURONTIN) 400 MG capsule Take 800 mg by mouth 4 (four) times daily.      insulin glargine (LANTUS) 100 UNIT/ML injection Inject 40 Units into the skin 2 (two) times daily.      simvastatin (ZOCOR) 40 MG tablet Take 40 mg by mouth at bedtime.       Physical Exam: General: Alert and oriented.  No apparent distress.  Vascular: DP/PT pulses faintly palpable bilateral.  CFT appears  to be intact to digits bilateral.  Neuro: Light touch sensation absent to bilateral lower extremities  Derm: Left partial fifth ray amputation site with sutures intact, mild maceration to the skin flaps but capillary fill time appears to be intact at this time.  Associated erythema and edema present consistent with postoperative course and overall decrease since admission.  No expressible purulence or odor.  Small area of dehiscence centrally with small amount of serous fluid coming from that area likely due to vancomycin powder that  was placed.    MSK: Mild left foot pain on palpation.  Left partial fifth ray amputation  Results for orders placed or performed during the hospital encounter of 12/10/19 (from the past 48 hour(s))  Glucose, capillary     Status: Abnormal   Collection Time: 12/12/19  9:16 PM  Result Value Ref Range   Glucose-Capillary 146 (H) 70 - 99 mg/dL    Comment: Glucose reference range applies only to samples taken after fasting for at least 8 hours.  CBC     Status: Abnormal   Collection Time: 12/13/19 12:00 AM  Result Value Ref Range   WBC 9.1 4.0 - 10.5 K/uL   RBC 2.99 (L) 4.22 - 5.81 MIL/uL   Hemoglobin 8.8 (L) 13.0 - 17.0 g/dL   HCT 16.1 (L) 39 - 52 %   MCV 88.0 80.0 - 100.0 fL   MCH 29.4 26.0 - 34.0 pg   MCHC 33.5 30.0 - 36.0 g/dL   RDW 09.6 04.5 - 40.9 %   Platelets 314 150 - 400 K/uL   nRBC 0.0 0.0 - 0.2 %    Comment: Performed at [facility], [address] [place], [place] [address]  CULTURE, BLOOD (ROUTINE X 2) w Reflex to ID Panel     Status: None (Preliminary result)   Collection Time: 12/13/19 12:00 AM   Specimen: BLOOD  Result Value Ref Range   Specimen Description BLOOD RAC    Special Requests BOTTLES DRAWN AEROBIC AND ANAEROBIC BCAV    Culture      NO GROWTH 1 DAY Performed at [facility], [address] [place], [place] [address]    Report Status PENDING   CULTURE, BLOOD (ROUTINE X 2) w Reflex to ID Panel     Status: None (Preliminary result)   Collection Time: 12/13/19 12:11 AM   Specimen: BLOOD  Result Value Ref Range   Specimen Description BLOOD RH    Special Requests BOTTLES DRAWN AEROBIC AND ANAEROBIC BCAV    Culture      NO GROWTH 1 DAY Performed at [facility], [address] [place], [place] [address]    Report Status PENDING   Glucose, capillary     Status: Abnormal   Collection Time: 12/13/19  7:45 AM  Result Value Ref Range   Glucose-Capillary 147 (H) 70 - 99 mg/dL    Comment: Glucose reference range applies  only to samples taken after fasting for at least 8 hours.   Comment 1 Notify RN   Vitamin B12     Status: None   Collection Time: 12/13/19  9:29 AM  Result Value Ref Range   Vitamin B-12 366 180 - 914 pg/mL    Comment: (NOTE) This assay is not validated for testing neonatal or myeloproliferative syndrome specimens for Vitamin B12 levels. Performed at [facility], [address], [place], [place] [address]   Vancomycin, trough     Status: Abnormal   Collection Time: 12/13/19  9:30 AM  Result Value Ref Range   Vancomycin Tr 14 (L) 15 - 20 ug/mL    Comment: Performed at [facility], [address] [place], [place] [address]  Creatinine, serum     Status: None   Collection Time: 12/13/19  9:30 AM  Result Value Ref Range   Creatinine, Ser 0.94 0.61 - 1.24 mg/dL   GFR calc non Af Amer >60 >60 mL/min   GFR calc Af Amer >60 >60 mL/min    Comment: Performed at [facility], [address] [place], [place] [address]  Iron and TIBC     Status: Abnormal   Collection Time: 12/13/19  9:30 AM  Result Value Ref Range   Iron 17 (L) 45 - 182 ug/dL   TIBC 086 (L) 578 - 469 ug/dL   Saturation Ratios 10 (L) 17.9 - 39.5 %   UIBC 148 ug/dL    Comment: Performed at [facility], [address] [place], [place] [address]  Ferritin     Status: None   Collection Time: 12/13/19  9:30 AM  Result Value Ref Range   Ferritin 175 24 - 336 ng/mL    Comment: Performed at [facility], [address] [place], [place] [address]  Glucose, capillary     Status: Abnormal   Collection Time: 12/13/19 11:35 AM  Result Value Ref Range   Glucose-Capillary 119 (H) 70 - 99 mg/dL    Comment: Glucose reference range applies only to samples taken after fasting for at least 8 hours.  Glucose, capillary     Status: Abnormal   Collection Time: 12/13/19  2:08 PM  Result Value Ref Range   Glucose-Capillary 103 (H) 70 - 99 mg/dL    Comment: Glucose reference range applies only  to samples taken after fasting for at least 8 hours.  HCV RNA quant     Status: None   Collection Time: 12/13/19  4:16 PM  Result Value Ref Range   HCV Quantitative HCV Not Detected >50 IU/mL   Test Information Comment     Comment: (NOTE) The quantitative range of this assay is 15 IU/mL to 100 million IU/mL. Performed At: [facility] [address] [place], [place] [id number] Jolene Schimke MD DG:[id number]   Hepatitis C antibody     Status: Abnormal   Collection Time: 12/13/19  4:16 PM  Result Value Ref Range   HCV Ab Reactive (A) NON REACTIVE    Comment: (NOTE) The CDC recommends that a Reactive HCV antibody result be followed up  with a HCV Nucleic Acid Amplification test.  Performed at [facility], [address] [place], [place] [address]   Glucose, capillary     Status: Abnormal   Collection Time: 12/13/19  4:18 PM  Result Value Ref Range   Glucose-Capillary 142 (H) 70 - 99 mg/dL    Comment: Glucose reference range applies only to samples taken after fasting for at least 8 hours.  Glucose, capillary     Status: Abnormal   Collection Time: 12/13/19  9:05 PM  Result Value Ref Range   Glucose-Capillary 165 (H) 70 - 99 mg/dL    Comment: Glucose reference range applies only to samples taken after fasting for at least 8 hours.  CBC     Status: Abnormal   Collection Time: 12/14/19  4:47 AM  Result Value Ref Range   WBC 8.5 4.0 - 10.5 K/uL   RBC 3.04 (L) 4.22 - 5.81 MIL/uL  Hemoglobin 9.0 (L) 13.0 - 17.0 g/dL   HCT 27.2 (L) 39 - 52 %   MCV 89.5 80.0 - 100.0 fL   MCH 29.6 26.0 - 34.0 pg   MCHC 33.1 30.0 - 36.0 g/dL   RDW 12.5 11.5 - 15.5 %   Platelets 351 150 - 400 K/uL   nRBC 0.0 0.0 - 0.2 %    Comment: Performed at [facility], [address], [place], [place] [address]  Basic metabolic panel     Status: Abnormal   Collection Time: 12/14/19  4:47 AM  Result Value Ref Range   Sodium 137 135 - 145 mmol/L   Potassium 4.2 3.5 - 5.1 mmol/L    Chloride 106 98 - 111 mmol/L   CO2 24 22 - 32 mmol/L   Glucose, Bld 114 (H) 70 - 99 mg/dL    Comment: Glucose reference range applies only to samples taken after fasting for at least 8 hours.   BUN 16 8 - 23 mg/dL   Creatinine, Ser 0.84 0.61 - 1.24 mg/dL   Calcium 7.6 (L) 8.9 - 10.3 mg/dL   GFR calc non Af Amer >60 >60 mL/min   GFR calc Af Amer >60 >60 mL/min   Anion gap 7 5 - 15    Comment: Performed at [facility], [address] [place], [place] [address]  Glucose, capillary     Status: Abnormal   Collection Time: 12/14/19  7:41 AM  Result Value Ref Range   Glucose-Capillary 102 (H) 70 - 99 mg/dL    Comment: Glucose reference range applies only to samples taken after fasting for at least 8 hours.   Comment 1 Notify RN   Glucose, capillary     Status: Abnormal   Collection Time: 12/14/19 11:37 AM  Result Value Ref Range   Glucose-Capillary 178 (H) 70 - 99 mg/dL    Comment: Glucose reference range applies only to samples taken after fasting for at least 8 hours.   Comment 1 Notify RN   Glucose, capillary     Status: Abnormal   Collection Time: 12/14/19  3:48 PM  Result Value Ref Range   Glucose-Capillary 173 (H) 70 - 99 mg/dL    Comment: Glucose reference range applies only to samples taken after fasting for at least 8 hours.   Comment 1 Notify RN    ECHO TEE  Result Date: 12/14/2019    TRANSESOPHOGEAL ECHO REPORT   Patient Name:   Edward Richard Date of Exam: 12/13/2019 Medical Rec #:  [id number]        Height:       69.0 in Accession #:    [id number]       Weight:       183.9 lb Date of Birth:  1955-08-11        BSA:          1.993 m Patient Age:    58 years         BP:           126/63 mmHg Patient Gender: M                HR:           66 bpm. Exam Location:  [facility] Procedure: Transesophageal Echo, Cardiac Doppler, Color Doppler and Saline            Contrast Bubble Study Indications:     No Diagnosis listed on order  History:  Patient has no prior history of  Echocardiogram examinations.                  Previous Myocardial Infarction, COPD; Risk Factors:Hypertension                  and Diabetes.  Sonographer:     Cristela Blue RDCS (AE) Referring Phys:  [id number] Lamar Blinks Diagnosing Phys: Harold Hedge MD PROCEDURE: After discussion of the risks and benefits of a TEE, an informed consent was obtained from the patient. TEE procedure time was 13 minutes. The transesophogeal probe was passed without difficulty through the esophogus of the patient. Imaged were obtained with the patient in a left lateral decubitus position. Local oropharyngeal anesthetic was provided with viscous lidocaine and Benzocaine spray. Sedation performed by performing physician. Image quality was excellent. The patient's vital signs; including heart rate, blood pressure, and oxygen saturation; remained stable throughout the procedure. The patient developed no complications during the procedure. IMPRESSIONS  1. Left ventricular ejection fraction, by estimation, is 60 to 65%. The left ventricle has normal function. The left ventricle has no regional wall motion abnormalities.  2. Right ventricular systolic function is normal. The right ventricular size is normal.  3. No left atrial/left atrial appendage thrombus was detected.  4. The mitral valve is normal in structure. Trivial mitral valve regurgitation.  5. The aortic valve is tricuspid. Aortic valve regurgitation is not visualized. FINDINGS  Left Ventricle: Left ventricular ejection fraction, by estimation, is 60 to 65%. The left ventricle has normal function. The left ventricle has no regional wall motion abnormalities. The left ventricular internal cavity size was normal in size. There is  no left ventricular hypertrophy. Right Ventricle: The right ventricular size is normal. No increase in right ventricular wall thickness. Right ventricular systolic function is normal. Left Atrium: Left atrial size was normal in size. No left atrial/left  atrial appendage thrombus was detected. Right Atrium: Right atrial size was normal in size. Pericardium: There is no evidence of pericardial effusion. Mitral Valve: The mitral valve is normal in structure. Trivial mitral valve regurgitation. Tricuspid Valve: The tricuspid valve is normal in structure. Tricuspid valve regurgitation is mild. Aortic Valve: The aortic valve is tricuspid. Aortic valve regurgitation is not visualized. Pulmonic Valve: The pulmonic valve was grossly normal. Pulmonic valve regurgitation is trivial. Aorta: The aortic root is normal in size and structure. IAS/Shunts: No atrial level shunt detected by color flow Doppler. Agitated saline contrast was given intravenously to evaluate for intracardiac shunting. Harold Hedge MD Electronically signed by Harold Hedge MD Signature Date/Time: 12/14/2019/8:14:44 AM    Final     Blood pressure 130/68, pulse 64, temperature 98.8 F (37.1 C), temperature source Oral, resp. rate 14, height 5\' 9"  (1.753 m), weight 83.4 kg, SpO2 97 %.  Assessment 1. 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 Diabetes type 2 with polyneuropathy 3. PVD 4. History of tobacco abuse 5. Chronic pain  Plan -Patient seen and examined. -Dressing removed and wound examined today.  Sutures appear to be intact and skin edges appear to be well coapted.  Small area that appears to have superficial dehiscence at the central aspect of the incision line over the area of likely maximal tension.  Discussed with patient that we will likely have to perform local wound care on this area in an outpatient setting. -Overall erythema and edema appears to be improved since preoperative state  and since admission.  Applied Betadine to the incision line and operative area followed by 4 x 4 gauze, ABD, Kerlix, Coban.  Patient instructed to keep dressings clean, dry, and intact until  postoperative follow-up visit. -MRSA bacteremia on admission.  Most recent blood cultures with no growth to date.  Wound culture/bone culture growing MRSA, strep galloyticus.  Pathology result had clear margin with no signs of osteomyelitis remaining in foot.  Appreciate ID recommendations for antibiotic therapy.  Discussed with Dr. Joylene Draft in detail.  Patient to go home tomorrow after getting IV antibiotic therapy that will last him a week.  Patient will then have to have someone come to his house and receive the second dose for a 2-week interval of time. -Follow-up instructions placed in chart.  Patient is to follow-up in clinic within the next week for dressing change and evaluation of wound. -Appreciate PT recommendations. -Patient to still try to remain nonweightbearing for the most part to left lower extremity all times.  Patient is to use a surgical shoe when ambulating and to walk with heel contact only.  Stressed importance of trying to stay off the foot is much as possible until the incision site/wound is healed. -Stressed importance of tight blood glucose control.  Podiatry team to sign off at this time.  Discharge instructions placed in chart.  Please contact if any further questions.  Rosetta Posner, DPM 12/14/2019, 6:31 PM

## 2019-12-14 NOTE — Plan of Care (Signed)

## 2019-12-15 DIAGNOSIS — R7881 Bacteremia: Secondary | ICD-10-CM | POA: Diagnosis present

## 2019-12-15 DIAGNOSIS — E1165 Type 2 diabetes mellitus with hyperglycemia: Secondary | ICD-10-CM

## 2019-12-15 DIAGNOSIS — E1142 Type 2 diabetes mellitus with diabetic polyneuropathy: Secondary | ICD-10-CM

## 2019-12-15 DIAGNOSIS — Z794 Long term (current) use of insulin: Secondary | ICD-10-CM

## 2019-12-15 DIAGNOSIS — L03116 Cellulitis of left lower limb: Secondary | ICD-10-CM | POA: Diagnosis present

## 2019-12-15 DIAGNOSIS — D509 Iron deficiency anemia, unspecified: Secondary | ICD-10-CM | POA: Diagnosis present

## 2019-12-15 DIAGNOSIS — M86172 Other acute osteomyelitis, left ankle and foot: Secondary | ICD-10-CM | POA: Diagnosis present

## 2019-12-15 DIAGNOSIS — B182 Chronic viral hepatitis C: Secondary | ICD-10-CM

## 2019-12-15 LAB — GLUCOSE, CAPILLARY
Glucose-Capillary: 227 mg/dL — ABNORMAL HIGH (ref 70–99)
Glucose-Capillary: 223 mg/dL — ABNORMAL HIGH (ref 70–99)

## 2019-12-15 MED ORDER — NICOTINE 14 MG/24HR TD PT24: 14.0000 mg | MEDICATED_PATCH | Freq: Every day | TRANSDERMAL | 0 refills | Status: DC

## 2019-12-15 MED ORDER — ORITAVANCIN DIPHOSPHATE 400 MG IV SOLR: 1200.0000 mg | INTRAVENOUS | 0 refills | Status: DC

## 2019-12-15 MED ORDER — FERROUS SULFATE 325 (65 FE) MG PO TBEC
325.0000 mg | DELAYED_RELEASE_TABLET | Freq: Two times a day (BID) | ORAL | 3 refills | Status: DC
Start: 2019-12-15 — End: 2019-12-27

## 2019-12-15 MED ORDER — ORITAVANCIN DIPHOSPHATE 400 MG IV SOLR
1200.0000 mg | Freq: Once | INTRAVENOUS | Status: AC
  Administered 2019-12-15: 1200 mg via INTRAVENOUS
  Filled 2019-12-15: qty 120

## 2019-12-15 MED ORDER — OXYCODONE-ACETAMINOPHEN 5-325 MG PO TABS: 2.0000 | ORAL_TABLET | Freq: Four times a day (QID) | ORAL | 0 refills | Status: AC | PRN

## 2019-12-15 MED ORDER — POLYETHYLENE GLYCOL 3350 17 G PO PACK: 17.0000 g | PACK | Freq: Every day | ORAL | 0 refills | Status: DC

## 2019-12-15 NOTE — Discharge Summary (Signed)
Physician Discharge Summary  [name] HQI:[id number] DOB: 10/18/1955 DOA: 12/10/2019  PCP: Preston Fleeting, MD  Admit date: 12/10/2019 Discharge date: 12/15/2019  Admitted From: Home Disposition: Home  Recommendations for Outpatient Follow-up:  1. Follow up with PCP in 1 week 2. Follow-up with Dr. Excell Seltzer in 1-2 weeks. 3. Patient will be discharged on IV oritavancin every 7-10 days (will be administered by infusion nurse at home) for 4 weeks.  Home Health: None (infusion nurse to follow at home for IV antibiotic every 7-10 days, patient able to do wound dressing at home) Equipment/Devices: Rolling walker, 3 and 1, wheelchair  Discharge Condition: Fair CODE STATUS: Full code Diet recommendation: Heart Healthy / Carb Modified / Regular / Dysphagia    Discharge Diagnoses:  Principal Problem:   Acute osteomyelitis of left foot (HCC)   Active Problems:   MRSA bacteremia   Diabetic peripheral neuropathy (HCC)   Cellulitis of left lower extremity   Uncontrolled type 2 diabetes mellitus with hyperglycemia, with long-term current use of insulin (HCC)   Chronic hepatitis C without hepatic coma (HCC)   Neuropathic pain   Diabetic ulcer of left foot associated with type 2 diabetes mellitus (HCC)   Iron deficiency anemia   Brief narrative/HPI 64 year old male with uncontrolled type 2 diabetes mellitus with peripheral neuropathy, hypertension, hyperlipidemia, ongoing tobacco use presented with  osteomyelitis and gas gangrene of the left fifth metatarsal head and fifth proximal phalanx and cellulitis of dorsal lateral forefoot. 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   Hospital course  Principal problem Diabetic osteomyelitis of the left foot. 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  Recommend partial weightbearing with heel contact in a surgical shoe.  PT recommends SNF but patient refuses and wants to go home).   Home health equipments provided. Pain control with Percocet as needed .  Wound culture growing MRSA and Streptococcus Gallolyticus.  On empiric IV vancomycin, plan on IV oritavancin as outpatient.   Vascular surgery consult appreciated.  Aortogram done without significant peripheral vascular disease.  Dressing done by podiatry on 6/11.  Instructed to keep dressing clean dry and intact until outpatient follow-up visit next week for dressing change.  Active Problems:  Diabetic ulcer of left foot associated with uncontrolled type 2 diabetes mellitus (HCC) Continue empiric antibiotics.  A1c of 9.2.    Continue home dose insulin and Farxiga.   Needs tight blood glucose monitoring as outpatient.  On high-dose gabapentin for neuropathy which is continued.  MRSA bacteremia Positive in 1/2 blood culture.  Resistant to oxacillin.   Patient hesitant to administer IV vancomycin at home.  ID recommended IV oritavancin every 7-10 days for 4 doses.  Will receive 1 dose prior to discharge today. Home infusion nurse will follow with him at home to provide antibiotic at home. Follow-up with PCP and ID.  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  Denies IV drug use (recent or remote history).  Follow HCV RNA.  Hyperlipidemia Continue statin.  Ongoing tobacco use Counseled on cessation.  Nicotine patch prescribed  Essential hypertension Continue amlodipine and metoprolol  Iron deficiency/postoperative blood loss anemia Hemoglobin dropped was 8.8 from baseline 13 2 years back.  Iron panel suggest iron deficiency.  Stool for Hemoccult pending.    Will discharge on iron supplement and needs outpatient follow-up.   Procedure:  Partial fifth ray amputation of the left foot, aortogram  Consults: ID, podiatry, vascular surgery  Family communication: None Disposition:  Home   Discharge Instructions   Allergies as of 12/15/2019   No Known Allergies     Medication List    TAKE these  medications   amLODipine 10 MG tablet Commonly known as: NORVASC Take 10 mg by mouth daily.   DULoxetine 30 MG capsule Commonly known as: CYMBALTA Take 30 mg by mouth daily.   Farxiga 10 MG Tabs tablet Generic drug: dapagliflozin propanediol Take 10 mg by mouth daily.   ferrous sulfate 325 (65 FE) MG EC tablet Take 1 tablet (325 mg total) by mouth 2 (two) times daily.   gabapentin 400 MG capsule Commonly known as: NEURONTIN Take 800 mg by mouth 4 (four) times daily.   insulin glargine 100 UNIT/ML injection Commonly known as: LANTUS Inject 40 Units into the skin 2 (two) times daily.   nicotine 14 mg/24hr patch Commonly known as: NICODERM CQ - dosed in mg/24 hours Place 1 patch (14 mg total) onto the skin daily. Start taking on: December 16, 2019   Oritavancin Diphosphate 1,200 mg in dextrose 5 % 880 mL Inject 1,200 mg into the vein every 7 (seven) days. Start taking on: December 24, 2019   oxyCODONE-acetaminophen 5-325 MG tablet Commonly known as: Percocet Take 2 tablets by mouth every 6 (six) hours as needed for up to 7 days for moderate pain or severe pain.   polyethylene glycol 17 g packet Commonly known as: MIRALAX / GLYCOLAX Take 17 g by mouth daily. Start taking on: December 16, 2019   simvastatin 40 MG tablet Commonly known as: ZOCOR Take 40 mg by mouth at bedtime.            Durable Medical Equipment  (From admission, onward)         Start     Ordered   12/14/19 1048  For home use only DME lightweight manual wheelchair with seat cushion  Once       Comments: Patient suffers from amputation and non weight bearing which impairs their ability to perform daily activities like ADLs in the home.  A walking aid will not resolve issue with performing activities of daily living. A wheelchair will allow patient to safely perform daily activities. Patient is not able to propel themselves in the home using a standard weight wheelchair due to weakness. Patient can self propel  in the lightweight wheelchair. Length of need 99 months Accessories: elevating leg rests back cushion wheel locks, extensions and anti-tippers elevated leg rest.   12/14/19 1048          Follow-up Information    Rosetta Posner, DPM. Schedule an appointment as soon as possible for a visit in 1 week(s).   Specialty: Podiatry Contact information: [address] [place] [address] [phone]        Preston Fleeting, MD. Schedule an appointment as soon as possible for a visit in 1 week(s).   Specialty: Family Medicine Contact information: [address] [place] [address] [phone]              No Known Allergies    Procedures/Studies: MR FOOT LEFT W WO CONTRAST  Result Date: 12/10/2019 CLINICAL DATA:  Wound around the fifth MTP joint. Evaluate for osteomyelitis. EXAM: MRI OF THE LEFT FOREFOOT WITHOUT AND WITH CONTRAST TECHNIQUE: Multiplanar, multisequence MR imaging of the left forefoot was performed both before and after administration of intravenous contrast. CONTRAST:  7mL GADAVIST GADOBUTROL 1 MMOL/ML IV SOLN COMPARISON:  Left foot x-rays from same day. FINDINGS: Bones/Joint/Cartilage  Abnormal marrow edema and enhancement with corresponding decreased T1 marrow signal involving the fifth metatarsal head and base of the fifth proximal phalanx, consistent with osteomyelitis. No fracture or dislocation. Mild edema within the third and fourth metatarsal shafts preserved T1 marrow signal, possibly stress related. 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 Small second TMT joint effusion. Ligaments Collateral ligaments are intact.  Lisfranc ligament is intact. Muscles and Tendons Flexor, peroneal and extensor compartment tendons are intact. Extensive fatty atrophy of the intrinsic foot muscles. Soft tissue Ulceration at the lateral aspect of the fifth metatarsal head extending to bone. Small area of surrounding devitalized  soft tissue with foci of subcutaneous emphysema. Dorsal lateral forefoot skin thickening and enhancement. No fluid collection or hematoma. No soft tissue mass. IMPRESSION: 1. Ulceration at the lateral aspect of the fifth metatarsal head extending to bone with underlying osteomyelitis of the fifth metatarsal head and fifth proximal phalanx. 2. Small area of devitalized soft tissue adjacent to the ulceration. Cellulitis of the dorsal lateral forefoot. No abscess. Electronically Signed   By: Obie Dredge M.D.   On: 12/10/2019 21:37   PERIPHERAL VASCULAR CATHETERIZATION  Result Date: 12/11/2019 See Op Note  US Venous Img Lower Unilateral Left  Result Date: 12/10/2019 CLINICAL DATA:  Left lower extremity pain and swelling EXAM: LEFT LOWER EXTREMITY VENOUS DOPPLER ULTRASOUND TECHNIQUE: Gray-scale sonography with graded compression, as well as color Doppler and duplex ultrasound were performed to evaluate the lower extremity deep venous systems from the level of the common femoral vein and including the common femoral, femoral, profunda femoral, popliteal and calf veins including the posterior tibial, peroneal and gastrocnemius veins when visible. The superficial great saphenous vein was also interrogated. Spectral Doppler was utilized to evaluate flow at rest and with distal augmentation maneuvers in the common femoral, femoral and popliteal veins. COMPARISON:  None. FINDINGS: Contralateral Common Femoral Vein: Respiratory phasicity is normal and symmetric with the symptomatic side. No evidence of thrombus. Normal compressibility. Common Femoral Vein: No evidence of thrombus. Normal compressibility, respiratory phasicity and response to augmentation. Saphenofemoral Junction: No evidence of thrombus. Normal compressibility and flow on color Doppler imaging. Profunda Femoral Vein: No evidence of thrombus. Normal compressibility and flow on color Doppler imaging. Femoral Vein: No evidence of thrombus. Normal  compressibility, respiratory phasicity and response to augmentation. Popliteal Vein: No evidence of thrombus. Normal compressibility, respiratory phasicity and response to augmentation. Calf Veins: No evidence of thrombus. Normal compressibility and flow on color Doppler imaging. Superficial Great Saphenous Vein: No evidence of thrombus. Normal compressibility. Venous Reflux:  None. Other Findings:  None. IMPRESSION: No evidence of left lower extremity deep venous thrombosis. Electronically Signed   By: Duanne Guess D.O.   On: 12/10/2019 17:18   DG Foot Complete Left  Result Date: 12/11/2019 CLINICAL DATA:  Postop. EXAM: LEFT FOOT - COMPLETE 3+ VIEW COMPARISON:  Preoperative radiograph yesterday FINDINGS: Transmetatarsal amputation of the fifth ray. The remainder of the foot is intact. No evidence of osteomyelitis. Again seen plantar calcaneal spur and Achilles tendon enthesophyte. Well corticated density adjacent to the dorsal talus, favor sequela of remote prior injury. Vascular calcifications. IMPRESSION: Transmetatarsal amputation of the fifth ray. No immediate postoperative complication. Electronically Signed   By: Narda Rutherford M.D.   On: 12/11/2019 19:54   DG Foot Complete Left  Result Date: 12/10/2019 CLINICAL DATA:  Laceration, infection EXAM: LEFT FOOT - COMPLETE 3+ VIEW COMPARISON:  None. FINDINGS: No fracture or dislocation of the left foot. There is  a soft tissue wound overlying the distal fifth metatarsal and metatarsophalangeal joint overlying bandage material and some evidence of subcutaneous emphysema. Mild first metatarsophalangeal arthrosis. Soft tissue edema. Vascular calcinosis. IMPRESSION: 1. No fracture or dislocation of the left foot. No radiopaque foreign body. 2. There is a soft tissue wound overlying the distal fifth metatarsal and metatarsophalangeal joint. There is some evidence of subcutaneous emphysema, concerning for gas-forming infection. Electronically Signed   By: Lauralyn Primes M.D.   On: 12/10/2019 12:43   ECHO TEE  Result Date: 12/14/2019    TRANSESOPHOGEAL ECHO REPORT   Patient Name:   Edward Richard Date of Exam: 12/13/2019 Medical Rec #:  [id number]        Height:       69.0 in Accession #:    [id number]       Weight:       183.9 lb Date of Birth:  1955-11-17        BSA:          1.993 m Patient Age:    64 years         BP:           126/63 mmHg Patient Gender: M                HR:           66 bpm. Exam Location:  [facility] Procedure: Transesophageal Echo, Cardiac Doppler, Color Doppler and Saline            Contrast Bubble Study Indications:     No Diagnosis listed on order  History:         Patient has no prior history of Echocardiogram examinations.                  Previous Myocardial Infarction, COPD; Risk Factors:Hypertension                  and Diabetes.  Sonographer:     Cristela Blue RDCS (AE) Referring Phys:  [id number] Lamar Blinks Diagnosing Phys: Harold Hedge MD PROCEDURE: After discussion of the risks and benefits of a TEE, an informed consent was obtained from the patient. TEE procedure time was 13 minutes. The transesophogeal probe was passed without difficulty through the esophogus of the patient. Imaged were obtained with the patient in a left lateral decubitus position. Local oropharyngeal anesthetic was provided with viscous lidocaine and Benzocaine spray. Sedation performed by performing physician. Image quality was excellent. The patient's vital signs; including heart rate, blood pressure, and oxygen saturation; remained stable throughout the procedure. The patient developed no complications during the procedure. IMPRESSIONS  1. Left ventricular ejection fraction, by estimation, is 60 to 65%. The left ventricle has normal function. The left ventricle has no regional wall motion abnormalities.  2. Right ventricular systolic function is normal. The right ventricular size is normal.  3. No left atrial/left atrial appendage thrombus was detected.  4. The mitral  valve is normal in structure. Trivial mitral valve regurgitation.  5. The aortic valve is tricuspid. Aortic valve regurgitation is not visualized. FINDINGS  Left Ventricle: Left ventricular ejection fraction, by estimation, is 60 to 65%. The left ventricle has normal function. The left ventricle has no regional wall motion abnormalities. The left ventricular internal cavity size was normal in size. There is  no left ventricular hypertrophy. Right Ventricle: The right ventricular size is normal. No increase in right ventricular wall thickness. Right ventricular systolic function is normal. Left Atrium: Left  atrial size was normal in size. No left atrial/left atrial appendage thrombus was detected. Right Atrium: Right atrial size was normal in size. Pericardium: There is no evidence of pericardial effusion. Mitral Valve: The mitral valve is normal in structure. Trivial mitral valve regurgitation. Tricuspid Valve: The tricuspid valve is normal in structure. Tricuspid valve regurgitation is mild. Aortic Valve: The aortic valve is tricuspid. Aortic valve regurgitation is not visualized. Pulmonic Valve: The pulmonic valve was grossly normal. Pulmonic valve regurgitation is trivial. Aorta: The aortic root is normal in size and structure. IAS/Shunts: No atrial level shunt detected by color flow Doppler. Agitated saline contrast was given intravenously to evaluate for intracardiac shunting. Harold Hedge MD Electronically signed by Harold Hedge MD Signature Date/Time: 12/14/2019/8:14:44 AM    Final       Subjective: Seen and examined.  Pain better controlled on current pain meds.    Discharge Exam: Vitals:   12/14/19 2200 12/15/19 0752  BP: 132/60 (!) 144/77  Pulse: 64 (!) 52  Resp: 16 17  Temp: 98.3 F (36.8 C) 97.8 F (36.6 C)  SpO2: 96% 98%   Vitals:   12/14/19 1545 12/14/19 2048 12/14/19 2200 12/15/19 0752  BP: 130/68 (!) 114/58 132/60 (!) 144/77  Pulse: 64 64 64 (!) 52  Resp: 14  16 17   Temp: 98.8  F (37.1 C)  98.3 F (36.8 C) 97.8 F (36.6 C)  TempSrc: Oral  Oral Oral  SpO2: 97%  96% 98%  Weight:      Height:        General: Middle-aged male not in distress HEENT: Moist mucosa, supple neck Chest: Clear CVs: Normal S1-S2 GI: Soft, nondistended, nontender Musculoskeletal: Dressing over the left foot    The results of significant diagnostics from this hospitalization (including imaging, microbiology, ancillary and laboratory) are listed below for reference.     Microbiology: Recent Results (from the past 240 hour(s))  Culture, blood (Routine x 2)     Status: Abnormal   Collection Time: 12/10/19 12:13 PM   Specimen: BLOOD  Result Value Ref Range Status   Specimen Description   Final    BLOOD RIGHT ANTECUBITAL Performed at [facility], [address], [place], [place] [address]    Special Requests   Final    BOTTLES DRAWN AEROBIC AND ANAEROBIC Blood Culture results may not be optimal due to an excessive volume of blood received in culture bottles Performed at [facility], [address] [place], [place] [address]    Culture  Setup Time   Final    GRAM POSITIVE COCCI ANAEROBIC BOTTLE ONLY CRITICAL RESULT CALLED TO, READ BACK BY AND VERIFIED WITH: JASON ROBBINS 12/11/19 1647 KLW Performed at [facility], [address] [place], [place] [address]    Culture METHICILLIN RESISTANT STAPHYLOCOCCUS AUREUS (A)  Final   Report Status 12/13/2019 FINAL  Final   Organism ID, Bacteria METHICILLIN RESISTANT STAPHYLOCOCCUS AUREUS  Final      Susceptibility   Methicillin resistant staphylococcus aureus - MIC*    CIPROFLOXACIN <=0.5 SENSITIVE Sensitive     ERYTHROMYCIN >=8 RESISTANT Resistant     GENTAMICIN <=0.5 SENSITIVE Sensitive     OXACILLIN >=4 RESISTANT Resistant     TETRACYCLINE <=1 SENSITIVE Sensitive     VANCOMYCIN 1 SENSITIVE Sensitive     TRIMETH/SULFA <=10 SENSITIVE Sensitive     CLINDAMYCIN <=0.25 SENSITIVE Sensitive     RIFAMPIN  <=0.5 SENSITIVE Sensitive     Inducible Clindamycin NEGATIVE Sensitive     *  METHICILLIN RESISTANT STAPHYLOCOCCUS AUREUS  Blood Culture ID Panel (Reflexed)     Status: Abnormal   Collection Time: 12/10/19 12:13 PM  Result Value Ref Range Status   Enterococcus species NOT DETECTED NOT DETECTED Final   Listeria monocytogenes NOT DETECTED NOT DETECTED Final   Staphylococcus species DETECTED (A) NOT DETECTED Corrected    Comment: CRITICAL RESULT CALLED TO, READ BACK BY AND VERIFIED WITH: JASON ROBBINS 12/11/19 1647 KLW CORRECTED ON 06/09 AT 0853: PREVIOUSLY REPORTED AS DETECTED CRITICAL RESULT CALLED TO, READ BACK BY AND VERIFIED WITH: JASON ROBBINS 12/11/19 KLW    Staphylococcus aureus (BCID) DETECTED (A) NOT DETECTED Corrected    Comment: Methicillin (oxacillin)-resistant Staphylococcus aureus (MRSA). MRSA is predictably resistant to beta-lactam antibiotics (except ceftaroline). Preferred therapy is vancomycin unless clinically contraindicated. Patient requires contact precautions if  hospitalized. CRITICAL RESULT CALLED TO, READ BACK BY AND VERIFIED WITH: JASON ROBBINS 12/11/19 1647 KLW CORRECTED ON 06/09 AT 0853: PREVIOUSLY REPORTED AS DETECTED Methicillin (oxacillin) resistant Staphylococcus aureus (MRSA). MRSA is predictably resistant to beta lactam antibiotics (except ceftaroline). Preferred therapy is vancomycin unless clinically  contraindicated. Patient requires contact precautions if hospitalized. CRITICAL RESULT CALLED TO, READ BACK BY AND VERIFIED WITH: JASON ROBBINS 12/11/19 KLW    Methicillin resistance DETECTED (A) NOT DETECTED Final    Comment: CRITICAL RESULT CALLED TO, READ BACK BY AND VERIFIED WITH: JASON ROBBINS 12/11/19 1647 KLW    Streptococcus species NOT DETECTED NOT DETECTED Final   Streptococcus agalactiae NOT DETECTED NOT DETECTED Final   Streptococcus pneumoniae NOT DETECTED NOT DETECTED Final   Streptococcus pyogenes NOT DETECTED NOT DETECTED Final   Acinetobacter  baumannii NOT DETECTED NOT DETECTED Final   Enterobacteriaceae species NOT DETECTED NOT DETECTED Final   Enterobacter cloacae complex NOT DETECTED NOT DETECTED Final   Escherichia coli NOT DETECTED NOT DETECTED Final   Klebsiella oxytoca NOT DETECTED NOT DETECTED Final   Klebsiella pneumoniae NOT DETECTED NOT DETECTED Final   Proteus species NOT DETECTED NOT DETECTED Final   Serratia marcescens NOT DETECTED NOT DETECTED Final   Haemophilus influenzae NOT DETECTED NOT DETECTED Final   Neisseria meningitidis NOT DETECTED NOT DETECTED Final   Pseudomonas aeruginosa NOT DETECTED NOT DETECTED Final   Candida albicans NOT DETECTED NOT DETECTED Final   Candida glabrata NOT DETECTED NOT DETECTED Final   Candida krusei NOT DETECTED NOT DETECTED Final   Candida parapsilosis NOT DETECTED NOT DETECTED Final   Candida tropicalis NOT DETECTED NOT DETECTED Final    Comment: Performed at [facility], [address] [place], [place] [address]  Aerobic/Anaerobic Culture (surgical/deep wound)     Status: None (Preliminary result)   Collection Time: 12/10/19  7:26 PM   Specimen: Wound  Result Value Ref Range Status   Specimen Description   Final    WOUND Performed at [facility], [address] [place], [place] [address]    Special Requests   Final    NONE Performed at [facility], [address] [place], [place] [address]    Gram Stain   Final    NO WBC SEEN ABUNDANT GRAM POSITIVE COCCI ABUNDANT GRAM NEGATIVE RODS    Culture   Final    CULTURE REINCUBATED FOR BETTER GROWTH FEW BACTEROIDES VULGATUS BETA LACTAMASE POSITIVE Performed at [facility], [address] [place], [place] [address]    Report Status PENDING  Incomplete  SARS Coronavirus 2 by RT PCR (hospital order, performed in [facility] hospital lab) Nasopharyngeal Nasopharyngeal Swab  Status: None   Collection Time: 12/10/19 10:16 PM   Specimen: Nasopharyngeal Swab  Result Value Ref  Range Status   SARS Coronavirus 2 NEGATIVE NEGATIVE Final    Comment: (NOTE) SARS-CoV-2 target nucleic acids are NOT DETECTED. The SARS-CoV-2 RNA is generally detectable in upper and lower respiratory specimens during the acute phase of infection. The lowest concentration of SARS-CoV-2 viral copies this assay can detect is 250 copies / mL. A negative result does not preclude SARS-CoV-2 infection and should not be used as the sole basis for treatment or other patient management decisions.  A negative result may occur with improper specimen collection / handling, submission of specimen other than nasopharyngeal swab, presence of viral mutation(s) within the areas targeted by this assay, and inadequate number of viral copies (<250 copies / mL). A negative result must be combined with clinical observations, patient history, and epidemiological information. Fact Sheet for Patients:   BoilerBrush.com.cy Fact Sheet for Healthcare Providers: https://pope.com/ This test is not yet approved or cleared  by the [place] FDA and has been authorized for detection and/or diagnosis of SARS-CoV-2 by FDA under an Emergency Use Authorization (EUA).  This EUA will remain in effect (meaning this test can be used) for the duration of the COVID-19 declaration under Section 564(b)(1) of the Act, 21 U.S.C. section 360bbb-3(b)(1), unless the authorization is terminated or revoked sooner. Performed at [facility], [address], [place], [place] [address]   Surgical pcr screen     Status: None   Collection Time: 12/11/19 12:11 AM   Specimen: Nasal Mucosa; Nasal Swab  Result Value Ref Range Status   MRSA, PCR NEGATIVE NEGATIVE Final   Staphylococcus aureus NEGATIVE NEGATIVE Final    Comment: (NOTE) The Xpert SA Assay (FDA approved for NASAL specimens in patients 28 years of age and older), is one component of a comprehensive surveillance  program. It is not intended to diagnose infection nor to guide or monitor treatment. Performed at [facility], [address] [place], [place] [address]   Aerobic/Anaerobic Culture (surgical/deep wound)     Status: None (Preliminary result)   Collection Time: 12/11/19  5:37 PM   Specimen: PATH Digit amputation; Tissue  Result Value Ref Range Status   Specimen Description   Final    WOUND LEFT 5TH DIGIT Performed at [facility], [address] [place], [place] [address]    Special Requests   Final    NONE Performed at [facility], [address] [place], [place] [address]    Gram Stain   Final    FEW WBC PRESENT, PREDOMINANTLY PMN FEW GRAM POSITIVE COCCI RARE GRAM NEGATIVE RODS Performed at [facility], [address], [place], [place] [address]    Culture   Final    RARE METHICILLIN RESISTANT STAPHYLOCOCCUS AUREUS FEW STREPTOCOCCUS GALLOLYTICUS NO ANAEROBES ISOLATED; CULTURE IN PROGRESS FOR 5 DAYS    Report Status PENDING  Incomplete   Organism ID, Bacteria METHICILLIN RESISTANT STAPHYLOCOCCUS AUREUS  Final   Organism ID, Bacteria STREPTOCOCCUS GALLOLYTICUS  Final      Susceptibility   Methicillin resistant staphylococcus aureus - MIC*    CIPROFLOXACIN <=0.5 SENSITIVE Sensitive     ERYTHROMYCIN >=8 RESISTANT Resistant     GENTAMICIN <=0.5 SENSITIVE Sensitive     OXACILLIN >=4 RESISTANT Resistant     TETRACYCLINE <=1 SENSITIVE Sensitive     VANCOMYCIN 1 SENSITIVE Sensitive     TRIMETH/SULFA <=10 SENSITIVE Sensitive     CLINDAMYCIN <=  0.25 SENSITIVE Sensitive     RIFAMPIN <=0.5 SENSITIVE Sensitive     Inducible Clindamycin NEGATIVE Sensitive     * RARE METHICILLIN RESISTANT STAPHYLOCOCCUS AUREUS   Streptococcus gallolyticus - MIC*    PENICILLIN 0.12 SENSITIVE Sensitive     CEFTRIAXONE <=0.12 SENSITIVE Sensitive     ERYTHROMYCIN <=0.12 SENSITIVE Sensitive     LEVOFLOXACIN 2 SENSITIVE Sensitive     VANCOMYCIN <=0.12 SENSITIVE  Sensitive     * FEW STREPTOCOCCUS GALLOLYTICUS  CULTURE, BLOOD (ROUTINE X 2) w Reflex to ID Panel     Status: None (Preliminary result)   Collection Time: 12/13/19 12:00 AM   Specimen: BLOOD  Result Value Ref Range Status   Specimen Description BLOOD RAC  Final   Special Requests BOTTLES DRAWN AEROBIC AND ANAEROBIC BCAV  Final   Culture   Final    NO GROWTH 2 DAYS Performed at [facility], [place] [place], [place] [address]    Report Status PENDING  Incomplete  CULTURE, BLOOD (ROUTINE X 2) w Reflex to ID Panel     Status: None (Preliminary result)   Collection Time: 12/13/19 12:11 AM   Specimen: BLOOD  Result Value Ref Range Status   Specimen Description BLOOD RH  Final   Special Requests BOTTLES DRAWN AEROBIC AND ANAEROBIC BCAV  Final   Culture   Final    NO GROWTH 2 DAYS Performed at [facility], [address] [place], [place] [address]    Report Status PENDING  Incomplete     Labs: BNP (last 3 results) No results for input(s): BNP in the last 8760 hours. Basic Metabolic Panel: Recent Labs  Lab 12/10/19 1213 12/11/19 0530 12/12/19 0550 12/13/19 0930 12/14/19 0447  NA 134* 134* 136  --  137  K 3.9 4.4 4.5  --  4.2  CL 101 103 107  --  106  CO2 22 25 25   --  24  GLUCOSE 217* 250* 318*  --  114*  BUN 17 20 19   --  16  CREATININE 1.08 1.07 0.87 0.94 0.84  CALCIUM 8.5* 7.8* 7.7*  --  7.6*  MG  --   --  2.3  --   --    Liver Function Tests: Recent Labs  Lab 12/10/19 1213  AST 15  ALT 9  ALKPHOS 70  BILITOT 0.9  PROT 8.6*  ALBUMIN 3.5   No results for input(s): LIPASE, AMYLASE in the last 168 hours. No results for input(s): AMMONIA in the last 168 hours. CBC: Recent Labs  Lab 12/10/19 1213 12/11/19 0530 12/12/19 0550 12/13/19 0000 12/14/19 0447  WBC 13.5* 8.8 8.2 9.1 8.5  NEUTROABS 10.9*  --   --   --   --   HGB 11.5* 10.0* 9.7* 8.8* 9.0*  HCT 34.4* 30.6* 29.4* 26.3* 27.2*  MCV 88.2 89.7 89.1 88.0 89.5  PLT 357 294  301 314 351   Cardiac Enzymes: No results for input(s): CKTOTAL, CKMB, CKMBINDEX, TROPONINI in the last 168 hours. BNP: Invalid input(s): POCBNP CBG: Recent Labs  Lab 12/14/19 0741 12/14/19 1137 12/14/19 1548 12/14/19 2015 12/15/19 0751  GLUCAP 102* 178* 173* 119* 227*   D-Dimer No results for input(s): DDIMER in the last 72 hours. Hgb A1c No results for input(s): HGBA1C in the last 72 hours. Lipid Profile No results for input(s): CHOL, HDL, LDLCALC, TRIG, CHOLHDL, LDLDIRECT in the last 72 hours. Thyroid function studies No results for input(s): TSH, T4TOTAL, T3FREE, THYROIDAB in the last 72  hours.  Invalid input(s): FREET3 Anemia work up Recent Labs    12/13/19 0929 12/13/19 0930  VITAMINB12 366  --   FERRITIN  --  175  TIBC  --  165*  IRON  --  17*   Urinalysis    Component Value Date/Time   COLORURINE AMBER (A) 12/10/2019 1901   APPEARANCEUR HAZY (A) 12/10/2019 1901   LABSPEC 1.024 12/10/2019 1901   PHURINE 5.0 12/10/2019 1901   GLUCOSEU 150 (A) 12/10/2019 1901   HGBUR SMALL (A) 12/10/2019 1901   BILIRUBINUR NEGATIVE 12/10/2019 1901   KETONESUR NEGATIVE 12/10/2019 1901   PROTEINUR 100 (A) 12/10/2019 1901   NITRITE NEGATIVE 12/10/2019 1901   LEUKOCYTESUR NEGATIVE 12/10/2019 1901   Sepsis Labs Invalid input(s): PROCALCITONIN,  WBC,  LACTICIDVEN Microbiology Recent Results (from the past 240 hour(s))  Culture, blood (Routine x 2)     Status: Abnormal   Collection Time: 12/10/19 12:13 PM   Specimen: BLOOD  Result Value Ref Range Status   Specimen Description   Final    BLOOD RIGHT ANTECUBITAL Performed at [facility], [address] [place], [place] [address]    Special Requests   Final    BOTTLES DRAWN AEROBIC AND ANAEROBIC Blood Culture results may not be optimal due to an excessive volume of blood received in culture bottles Performed at [facility], [address] [place], [place] [address]    Culture  Setup Time   Final     GRAM POSITIVE COCCI ANAEROBIC BOTTLE ONLY CRITICAL RESULT CALLED TO, READ BACK BY AND VERIFIED WITH: JASON ROBBINS 12/11/19 1647 KLW Performed at [facility], [address] [place], [place] [address]    Culture METHICILLIN RESISTANT STAPHYLOCOCCUS AUREUS (A)  Final   Report Status 12/13/2019 FINAL  Final   Organism ID, Bacteria METHICILLIN RESISTANT STAPHYLOCOCCUS AUREUS  Final      Susceptibility   Methicillin resistant staphylococcus aureus - MIC*    CIPROFLOXACIN <=0.5 SENSITIVE Sensitive     ERYTHROMYCIN >=8 RESISTANT Resistant     GENTAMICIN <=0.5 SENSITIVE Sensitive     OXACILLIN >=4 RESISTANT Resistant     TETRACYCLINE <=1 SENSITIVE Sensitive     VANCOMYCIN 1 SENSITIVE Sensitive     TRIMETH/SULFA <=10 SENSITIVE Sensitive     CLINDAMYCIN <=0.25 SENSITIVE Sensitive     RIFAMPIN <=0.5 SENSITIVE Sensitive     Inducible Clindamycin NEGATIVE Sensitive     * METHICILLIN RESISTANT STAPHYLOCOCCUS AUREUS  Blood Culture ID Panel (Reflexed)     Status: Abnormal   Collection Time: 12/10/19 12:13 PM  Result Value Ref Range Status   Enterococcus species NOT DETECTED NOT DETECTED Final   Listeria monocytogenes NOT DETECTED NOT DETECTED Final   Staphylococcus species DETECTED (A) NOT DETECTED Corrected    Comment: CRITICAL RESULT CALLED TO, READ BACK BY AND VERIFIED WITH: JASON ROBBINS 12/11/19 1647 KLW CORRECTED ON 06/09 AT 0853: PREVIOUSLY REPORTED AS DETECTED CRITICAL RESULT CALLED TO, READ BACK BY AND VERIFIED WITH: JASON ROBBINS 12/11/19 KLW    Staphylococcus aureus (BCID) DETECTED (A) NOT DETECTED Corrected    Comment: Methicillin (oxacillin)-resistant Staphylococcus aureus (MRSA). MRSA is predictably resistant to beta-lactam antibiotics (except ceftaroline). Preferred therapy is vancomycin unless clinically contraindicated. Patient requires contact precautions if  hospitalized. CRITICAL RESULT CALLED TO, READ BACK BY AND VERIFIED WITH: JASON ROBBINS 12/11/19 1647 KLW CORRECTED ON  06/09 AT 0853: PREVIOUSLY REPORTED AS DETECTED Methicillin (oxacillin) resistant Staphylococcus aureus (MRSA). MRSA is predictably resistant to beta lactam antibiotics (except ceftaroline). Preferred therapy  is vancomycin unless clinically  contraindicated. Patient requires contact precautions if hospitalized. CRITICAL RESULT CALLED TO, READ BACK BY AND VERIFIED WITH: JASON ROBBINS 12/11/19 KLW    Methicillin resistance DETECTED (A) NOT DETECTED Final    Comment: CRITICAL RESULT CALLED TO, READ BACK BY AND VERIFIED WITH: JASON ROBBINS 12/11/19 1647 KLW    Streptococcus species NOT DETECTED NOT DETECTED Final   Streptococcus agalactiae NOT DETECTED NOT DETECTED Final   Streptococcus pneumoniae NOT DETECTED NOT DETECTED Final   Streptococcus pyogenes NOT DETECTED NOT DETECTED Final   Acinetobacter baumannii NOT DETECTED NOT DETECTED Final   Enterobacteriaceae species NOT DETECTED NOT DETECTED Final   Enterobacter cloacae complex NOT DETECTED NOT DETECTED Final   Escherichia coli NOT DETECTED NOT DETECTED Final   Klebsiella oxytoca NOT DETECTED NOT DETECTED Final   Klebsiella pneumoniae NOT DETECTED NOT DETECTED Final   Proteus species NOT DETECTED NOT DETECTED Final   Serratia marcescens NOT DETECTED NOT DETECTED Final   Haemophilus influenzae NOT DETECTED NOT DETECTED Final   Neisseria meningitidis NOT DETECTED NOT DETECTED Final   Pseudomonas aeruginosa NOT DETECTED NOT DETECTED Final   Candida albicans NOT DETECTED NOT DETECTED Final   Candida glabrata NOT DETECTED NOT DETECTED Final   Candida krusei NOT DETECTED NOT DETECTED Final   Candida parapsilosis NOT DETECTED NOT DETECTED Final   Candida tropicalis NOT DETECTED NOT DETECTED Final    Comment: Performed at [facility], [address] [place], [place] [address]  Aerobic/Anaerobic Culture (surgical/deep wound)     Status: None (Preliminary result)   Collection Time: 12/10/19  7:26 PM   Specimen: Wound  Result Value Ref  Range Status   Specimen Description   Final    WOUND Performed at [facility], [address], [place], [place] [address]    Special Requests   Final    NONE Performed at [facility], [address] [place], [place] [address]    Gram Stain   Final    NO WBC SEEN ABUNDANT GRAM POSITIVE COCCI ABUNDANT GRAM NEGATIVE RODS    Culture   Final    CULTURE REINCUBATED FOR BETTER GROWTH FEW BACTEROIDES VULGATUS BETA LACTAMASE POSITIVE Performed at [facility], [address] [place], [place] [address]    Report Status PENDING  Incomplete  SARS Coronavirus 2 by RT PCR (hospital order, performed in [facility] hospital lab) Nasopharyngeal Nasopharyngeal Swab     Status: None   Collection Time: 12/10/19 10:16 PM   Specimen: Nasopharyngeal Swab  Result Value Ref Range Status   SARS Coronavirus 2 NEGATIVE NEGATIVE Final    Comment: (NOTE) SARS-CoV-2 target nucleic acids are NOT DETECTED. The SARS-CoV-2 RNA is generally detectable in upper and lower respiratory specimens during the acute phase of infection. The lowest concentration of SARS-CoV-2 viral copies this assay can detect is 250 copies / mL. A negative result does not preclude SARS-CoV-2 infection and should not be used as the sole basis for treatment or other patient management decisions.  A negative result may occur with improper specimen collection / handling, submission of specimen other than nasopharyngeal swab, presence of viral mutation(s) within the areas targeted by this assay, and inadequate number of viral copies (<250 copies / mL). A negative result must be combined with clinical observations, patient history, and epidemiological information. Fact Sheet for Patients:   BoilerBrush.com.cy Fact Sheet for Healthcare Providers: https://pope.com/ This test is not yet approved or cleared  by the [place] FDA and has been  authorized for  detection and/or diagnosis of SARS-CoV-2 by FDA under an Emergency Use Authorization (EUA).  This EUA will remain in effect (meaning this test can be used) for the duration of the COVID-19 declaration under Section 564(b)(1) of the Act, 21 U.S.C. section 360bbb-3(b)(1), unless the authorization is terminated or revoked sooner. Performed at [facility], [address] [place], [place] [address]   Surgical pcr screen     Status: None   Collection Time: 12/11/19 12:11 AM   Specimen: Nasal Mucosa; Nasal Swab  Result Value Ref Range Status   MRSA, PCR NEGATIVE NEGATIVE Final   Staphylococcus aureus NEGATIVE NEGATIVE Final    Comment: (NOTE) The Xpert SA Assay (FDA approved for NASAL specimens in patients 75 years of age and older), is one component of a comprehensive surveillance program. It is not intended to diagnose infection nor to guide or monitor treatment. Performed at [facility], [address] [place], [place] [address]   Aerobic/Anaerobic Culture (surgical/deep wound)     Status: None (Preliminary result)   Collection Time: 12/11/19  5:37 PM   Specimen: PATH Digit amputation; Tissue  Result Value Ref Range Status   Specimen Description   Final    WOUND LEFT 5TH DIGIT Performed at [facility], [address] [place], [place] [address]    Special Requests   Final    NONE Performed at [facility], [address] [place], [place] [address]    Gram Stain   Final    FEW WBC PRESENT, PREDOMINANTLY PMN FEW GRAM POSITIVE COCCI RARE GRAM NEGATIVE RODS Performed at [facility], [address], [place], [place] [address]    Culture   Final    RARE METHICILLIN RESISTANT STAPHYLOCOCCUS AUREUS FEW STREPTOCOCCUS GALLOLYTICUS NO ANAEROBES ISOLATED; CULTURE IN PROGRESS FOR 5 DAYS    Report Status PENDING  Incomplete   Organism ID, Bacteria METHICILLIN RESISTANT STAPHYLOCOCCUS AUREUS  Final   Organism ID, Bacteria STREPTOCOCCUS  GALLOLYTICUS  Final      Susceptibility   Methicillin resistant staphylococcus aureus - MIC*    CIPROFLOXACIN <=0.5 SENSITIVE Sensitive     ERYTHROMYCIN >=8 RESISTANT Resistant     GENTAMICIN <=0.5 SENSITIVE Sensitive     OXACILLIN >=4 RESISTANT Resistant     TETRACYCLINE <=1 SENSITIVE Sensitive     VANCOMYCIN 1 SENSITIVE Sensitive     TRIMETH/SULFA <=10 SENSITIVE Sensitive     CLINDAMYCIN <=0.25 SENSITIVE Sensitive     RIFAMPIN <=0.5 SENSITIVE Sensitive     Inducible Clindamycin NEGATIVE Sensitive     * RARE METHICILLIN RESISTANT STAPHYLOCOCCUS AUREUS   Streptococcus gallolyticus - MIC*    PENICILLIN 0.12 SENSITIVE Sensitive     CEFTRIAXONE <=0.12 SENSITIVE Sensitive     ERYTHROMYCIN <=0.12 SENSITIVE Sensitive     LEVOFLOXACIN 2 SENSITIVE Sensitive     VANCOMYCIN <=0.12 SENSITIVE Sensitive     * FEW STREPTOCOCCUS GALLOLYTICUS  CULTURE, BLOOD (ROUTINE X 2) w Reflex to ID Panel     Status: None (Preliminary result)   Collection Time: 12/13/19 12:00 AM   Specimen: BLOOD  Result Value Ref Range Status   Specimen Description BLOOD RAC  Final   Special Requests BOTTLES DRAWN AEROBIC AND ANAEROBIC BCAV  Final   Culture   Final    NO GROWTH 2 DAYS Performed at [facility], [address] [place], [place] [address]    Report Status PENDING  Incomplete  CULTURE, BLOOD (ROUTINE X 2) w Reflex to ID Panel  Status: None (Preliminary result)   Collection Time: 12/13/19 12:11 AM   Specimen: BLOOD  Result Value Ref Range Status   Specimen Description BLOOD RH  Final   Special Requests BOTTLES DRAWN AEROBIC AND ANAEROBIC BCAV  Final   Culture   Final    NO GROWTH 2 DAYS Performed at [facility], [address], [place], [place] [address]    Report Status PENDING  Incomplete     Time coordinating discharge: 35 minutes  SIGNED:   Eddie North, MD  [facility] 12/15/2019, 10:59 AM Pager   If 7PM-7AM, please contact  night-coverage [email or web address] Password [email or web address]

## 2019-12-15 NOTE — Progress Notes (Addendum)
Discharge Note: Reviewed discharge instructions. Pt verbalized understanding.  Dressing to left foot to the amputated toe completed 12/15/19. Obtained vitals. D/C with wheelchair, a walker, and a BSC. Leaving facility with personal belongings.  Staff wheeled pt out.Transported to home via private vehicle by sister, Darleen.

## 2019-12-16 LAB — AEROBIC/ANAEROBIC CULTURE W GRAM STAIN (SURGICAL/DEEP WOUND): Gram Stain: NONE SEEN

## 2019-12-18 LAB — CULTURE, BLOOD (ROUTINE X 2)
Culture: NO GROWTH
Culture: NO GROWTH

## 2019-12-27 ENCOUNTER — Other Ambulatory Visit: Payer: Self-pay

## 2019-12-27 ENCOUNTER — Encounter: Payer: Self-pay | Admitting: Infectious Diseases

## 2019-12-27 ENCOUNTER — Ambulatory Visit: Payer: Medicaid Other | Attending: Infectious Diseases | Admitting: Infectious Diseases

## 2019-12-27 VITALS — BP 166/81 | HR 76 | Resp 16 | Ht 69.0 in | Wt 183.6 lb

## 2019-12-27 DIAGNOSIS — Z7984 Long term (current) use of oral hypoglycemic drugs: Secondary | ICD-10-CM | POA: Diagnosis not present

## 2019-12-27 DIAGNOSIS — R7881 Bacteremia: Secondary | ICD-10-CM

## 2019-12-27 DIAGNOSIS — E11628 Type 2 diabetes mellitus with other skin complications: Secondary | ICD-10-CM

## 2019-12-27 DIAGNOSIS — E1169 Type 2 diabetes mellitus with other specified complication: Secondary | ICD-10-CM | POA: Diagnosis not present

## 2019-12-27 DIAGNOSIS — Z79899 Other long term (current) drug therapy: Secondary | ICD-10-CM | POA: Diagnosis not present

## 2019-12-27 DIAGNOSIS — J449 Chronic obstructive pulmonary disease, unspecified: Secondary | ICD-10-CM | POA: Insufficient documentation

## 2019-12-27 DIAGNOSIS — I2510 Atherosclerotic heart disease of native coronary artery without angina pectoris: Secondary | ICD-10-CM | POA: Diagnosis not present

## 2019-12-27 DIAGNOSIS — F17210 Nicotine dependence, cigarettes, uncomplicated: Secondary | ICD-10-CM | POA: Diagnosis not present

## 2019-12-27 DIAGNOSIS — L089 Local infection of the skin and subcutaneous tissue, unspecified: Secondary | ICD-10-CM

## 2019-12-27 DIAGNOSIS — E7800 Pure hypercholesterolemia, unspecified: Secondary | ICD-10-CM | POA: Diagnosis not present

## 2019-12-27 DIAGNOSIS — E11621 Type 2 diabetes mellitus with foot ulcer: Secondary | ICD-10-CM | POA: Insufficient documentation

## 2019-12-27 DIAGNOSIS — I252 Old myocardial infarction: Secondary | ICD-10-CM | POA: Diagnosis not present

## 2019-12-27 DIAGNOSIS — B954 Other streptococcus as the cause of diseases classified elsewhere: Secondary | ICD-10-CM | POA: Insufficient documentation

## 2019-12-27 DIAGNOSIS — Z89422 Acquired absence of other left toe(s): Secondary | ICD-10-CM | POA: Insufficient documentation

## 2019-12-27 DIAGNOSIS — B9562 Methicillin resistant Staphylococcus aureus infection as the cause of diseases classified elsewhere: Secondary | ICD-10-CM | POA: Diagnosis not present

## 2019-12-27 DIAGNOSIS — L97528 Non-pressure chronic ulcer of other part of left foot with other specified severity: Secondary | ICD-10-CM | POA: Diagnosis not present

## 2019-12-27 DIAGNOSIS — M86172 Other acute osteomyelitis, left ankle and foot: Secondary | ICD-10-CM | POA: Insufficient documentation

## 2019-12-27 DIAGNOSIS — M8610 Other acute osteomyelitis, unspecified site: Secondary | ICD-10-CM

## 2019-12-27 DIAGNOSIS — I10 Essential (primary) hypertension: Secondary | ICD-10-CM | POA: Diagnosis not present

## 2019-12-27 NOTE — Patient Instructions (Signed)
You are here for follow up of the left foot infection- the foot is looking better post surgery with Iv antibiotics which you get once a week. You have 4 more doses of oritavancin wich will be given every Monday. Will see you in 2 weeks Keep your foot elevated while sitting Dont put pressure on the lateral margin at the site of the sutures while walking as it will split open. Try to minimize walking

## 2019-12-27 NOTE — Progress Notes (Signed)
NAME: Edward Richard  DOB: 08/12/1955  MRN: [id number]  Date/Time: 12/27/2019 11:13 AM   Subjective:  Pt patient here for follow-up after recent hospitalization. ? Edward Richard is a 64 y.o. male with a history of diabetes mellitus, hypertension, CAD, COPD was recently in the hospital between 12/10/2019 until 12/15/2019 for acute infection of the left foot at the site of the left fifth toe which was necrotic with underlying osteomyelitis and he had to undergo fifth fifth ray excision amputation and rotational flap closure on 12/11/2019.  Bone/wound culture was positive for MRSA and Streptococcus catalytic is. Blood culture from 12/10/2019 was positive for MRSA on anaerobic bottle.  Repeat blood culture from 12/13/2019 was negative.  He was on IV vancomycin.  TEE was done which was negative for endocarditis.  Patient wanted to avoid self IV administration at home and chose to do long-acting glycol peptide once a week.  He received oritavancin 1200 mg on the day of discharge and has received a second dose on 12/24/2019. He is here for follow-up.  He had a outpatient follow-up with the podiatrist on 12/21/2019 and as per Dr. Olin Pia note left partial fifth ray amputation site appears to be well coapted with sutures intact.  One area of central dehiscence is noted and appears to be mildly macerated around the incision line.  Mild serous drainage present likely consistent with postop course from vancomycin powder being placed.  No erythema present no odor and mild edema present.  He has a follow-up appointment on 12/28/2019. Doing well No fever or rash or diarrhea Walking around with special  boot says he walks on his heels- visiting his mom who is in the Hospital  Past Medical History:  Diagnosis Date  . COPD (chronic obstructive pulmonary disease) ([place])   . Diabetes mellitus without complication ([place])   . Hypercholesteremia   . Hypertension   . Myocardial infarction [facility])     Past Surgical History:    Procedure Laterality Date  . AMPUTATION Left 12/11/2019   Procedure: AMPUTATION RAY 5TH RAY PARTIAL;  Surgeon: Caroline More, DPM;  Location: [facility] ORS;  Service: Podiatry;  Laterality: Left;  . INCISION AND DRAINAGE Left 12/11/2019   Procedure: INCISION AND DRAINAGE;  Surgeon: Caroline More, DPM;  Location: [facility] ORS;  Service: Podiatry;  Laterality: Left;  . LOWER EXTREMITY ANGIOGRAPHY Left 12/11/2019   Procedure: Lower Extremity Angiography;  Surgeon: Katha Cabal, MD;  Location: [facility];  Service: Cardiovascular;  Laterality: Left;  . NO PAST SURGERIES    . TEE WITHOUT CARDIOVERSION N/A 12/13/2019   Procedure: TRANSESOPHAGEAL ECHOCARDIOGRAM (TEE);  Surgeon: Teodoro Spray, MD;  Location: [facility] ORS;  Service: Cardiovascular;  Laterality: N/A;    Social History   Socioeconomic History  . Marital status: Legally Separated    Spouse name: Not on file  . Number of children: Not on file  . Years of education: Not on file  . Highest education level: Not on file  Occupational History  . Not on file  Tobacco Use  . Smoking status: Current Every Day Smoker    Packs/day: 0.50    Types: Cigarettes  . Smokeless tobacco: Never Used  Substance and Sexual Activity  . Alcohol use: Yes  . Drug use: No  . Sexual activity: Yes  Other Topics Concern  . Not on file  Social History Narrative  . Not on file   Social Determinants of Health   Financial Resource Strain:   . Difficulty of Paying  Living Expenses:   Food Insecurity:   . Worried About Charity fundraiser in the Last Year:   . Arboriculturist in the Last Year:   Transportation Needs:   . Film/video editor (Medical):   Marland Kitchen Lack of Transportation (Non-Medical):   Physical Activity:   . Days of Exercise per Week:   . Minutes of Exercise per Session:   Stress:   . Feeling of Stress :   Social Connections:   . Frequency of Communication with Friends and Family:   . Frequency of Social Gatherings with Friends and  Family:   . Attends Religious Services:   . Active Member of Clubs or Organizations:   . Attends Archivist Meetings:   Marland Kitchen Marital Status:   Intimate Partner Violence:   . Fear of Current or Ex-Partner:   . Emotionally Abused:   Marland Kitchen Physically Abused:   . Sexually Abused:     Family History  Problem Relation Age of Onset  . Cancer Mother   . Heart failure Father   . Cancer Father    No Known Allergies  Current Outpatient Medications  Medication Sig Dispense Refill  . amLODipine (NORVASC) 10 MG tablet Take 10 mg by mouth daily.    . dapagliflozin propanediol (FARXIGA) 10 MG TABS tablet Take 10 mg by mouth daily.    . DULoxetine (CYMBALTA) 30 MG capsule Take 30 mg by mouth daily.    . FEROSUL 325 (65 Fe) MG tablet Take 325 mg by mouth 2 (two) times daily.    Marland Kitchen gabapentin (NEURONTIN) 400 MG capsule Take 800 mg by mouth 4 (four) times daily.     . insulin glargine (LANTUS) 100 UNIT/ML injection Inject 40 Units into the skin 2 (two) times daily.     Marland Kitchen MIRALAX 17 GM/SCOOP powder Take 17 g by mouth daily.    . Oritavancin Diphosphate 1,200 mg in dextrose 5 % 880 mL Inject 1,200 mg into the vein every 7 (seven) days. 2 Dose 0  . simvastatin (ZOCOR) 40 MG tablet Take 40 mg by mouth at bedtime.      No current facility-administered medications for this visit.     Abtx:  Anti-infectives (From admission, onward)   None      REVIEW OF SYSTEMS:  Const: negative fever, negative chills, negative weight loss Eyes: negative diplopia or visual changes, negative eye pain ENT: negative coryza, negative sore throat Resp: negative cough, hemoptysis, dyspnea Cards: negative for chest pain, palpitations, lower extremity edema GU: negative for frequency, dysuria and hematuria GI: Negative for abdominal pain, diarrhea, bleeding, constipation Skin: negative for rash and pruritus Heme: negative for easy bruising and gum/nose bleeding MS: Left foot pain Neurolo:negative for headaches,  dizziness, vertigo, memory problems  Psych: negative for feelings of anxiety, depression  Endocrine: negative for thyroid, diabetes Allergy/Immunology- negative for any medication or food allergies ? Objective:  VITALS:  BP (!) 166/81   Pulse 76   Resp 16   Ht '5\' 9"'$  (1.753 m)   Wt 183 lb 9.6 oz (83.3 kg)   SpO2 98%   BMI 27.11 kg/m PHYSICAL EXAM:  General: Alert, cooperative, no distress,  ENT N Lips, mucosa, and tongue normal. No Thrush Neck: Supple, symmetrical, no adenopathy, thyroid: non tender no carotid bruit and no JVD. Back: No CVA tenderness. Lungs: Clear to auscultation bilaterally. No Wheezing or Rhonchi. No rales. Heart: Regular rate and rhythm, no murmur, rub or gallop. Abdomen: Did not examine as he is  in a wheelchair  Extremities:left foot dressing removed Sutures well copated- some  Small area of opening in the suture line          12/14/19    12/12/19       12/10/19        Skin: No rashes or lesions. Or bruising Lymph: Cervical, supraclavicular normal. Neurologic: Grossly non-focal Pertinent Labs  12/24/19   ESR 40 CRP 2 Cr N  ? Impression/Recommendation ? ?Diabetic foot infection with left necrotic wound on the fifth toe.  Acute osteomyelitis.  Status post fifth ray amputation.  MRSA bacteremia and MRSA in the culture.  He is getting long-acting glycol peptide oritavancin once a week intravenous at home by the visiting nurse.  He did not want to daily IV administration and this was a good option. The wound is healing well. He has an appointment to see Dr. Luana Shu tomorrow. The ESR is 40.  It was 95 on 12/10/2019. Continue oritavancin 800 mg once a week for 4 more weeks. ?Follow-up with podiatrist. ___________________________________________________ Discussed with patient,  Note:  This document was prepared using Dragon voice recognition software and may include unintentional dictation errors.

## 2020-01-08 ENCOUNTER — Ambulatory Visit: Payer: Medicaid Other | Admitting: Infectious Diseases

## 2020-01-17 ENCOUNTER — Ambulatory Visit: Payer: Medicaid Other | Admitting: Infectious Diseases

## 2020-12-27 IMAGING — MR MR FOOT*L* WO/W CM
10 series · 40 of 40 positions shown · IV contrast (gadavist)
Comparison: Left foot x-rays from same day.

CLINICAL DATA: Wound around the fifth MTP joint. Evaluate for
osteomyelitis.

EXAM:
MRI OF THE LEFT FOREFOOT WITHOUT AND WITH CONTRAST
TECHNIQUE: Multiplanar, multisequence MR imaging of the left forefoot was
performed both before and after administration of intravenous
contrast.
CONTRAST:  7mL GADAVIST GADOBUTROL 1 MMOL/ML IV SOLN

[Series 3: T1 · coronal · left · 3.0mm · 0.38mm/px · 6 of 47 slices shown (1 of 2)]
[im 1/47]
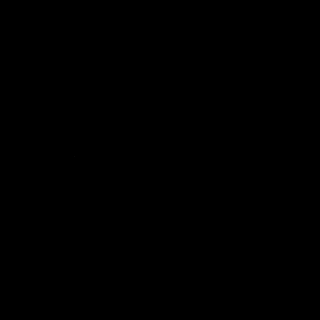
[im 10/47]
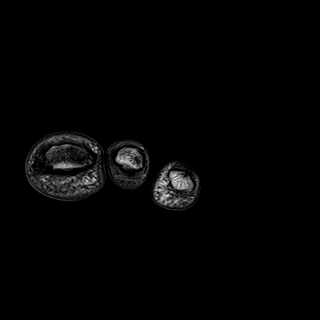
[im 19/47]
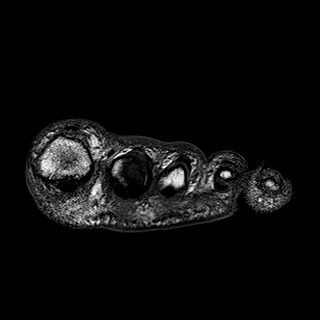
[im 28/47]
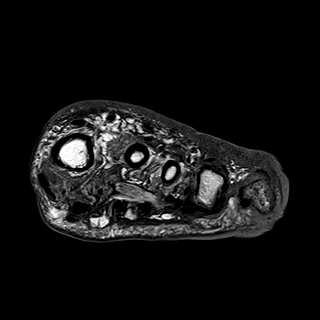
[im 37/47]
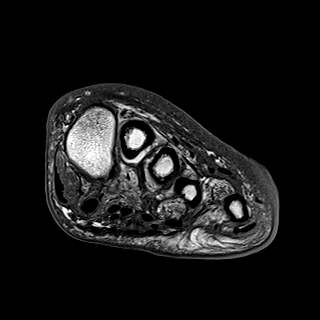
[im 47/47]
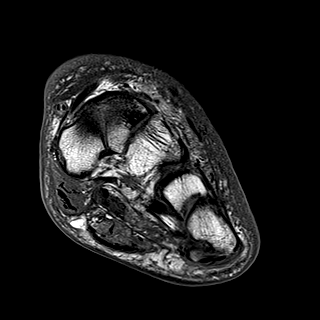

[Series 5: T2 · coronal · left · 3.0mm · 0.50mm/px · 6 of 46 slices shown (1 of 3)]
[im 1/46]
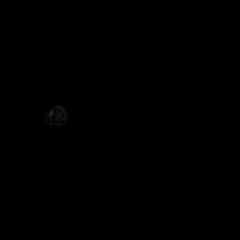
[im 10/46]
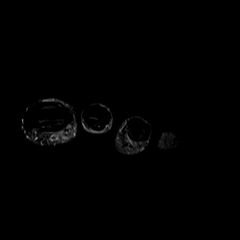
[im 19/46]
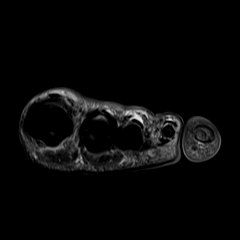
[im 28/46]
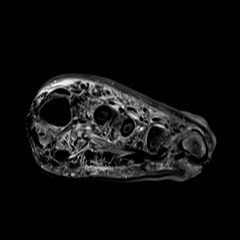
[im 37/46]
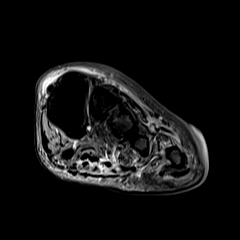
[im 46/46]
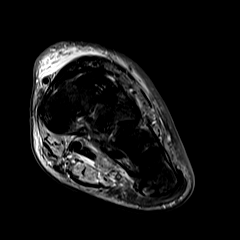

[Series 6: T1 · axial · left · 3.0mm · 0.70mm/px · z∈[-104,-34]mm · 2 of 20 slices shown (2 of 2)]
[im 1/20]
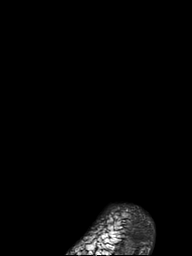
[im 20/20]
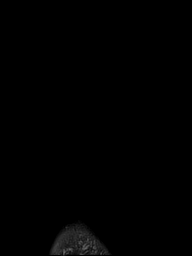

[Series 7: T2 · axial · left · 3.0mm · 0.70mm/px · z∈[-104,-34]mm · 2 of 20 slices shown (2 of 3)]
[im 1/20]
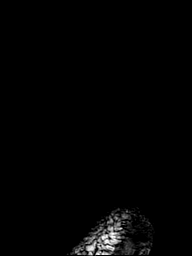
[im 20/20]
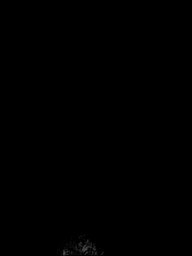

[Series 8: T2 · axial · left · 3.0mm · 0.70mm/px · z∈[-104,-34]mm · 2 of 20 slices shown (3 of 3)]
[im 1/20]
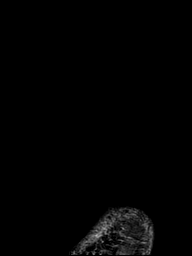
[im 20/20]
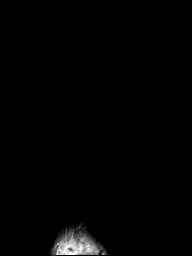

[Series 9: STIR · sagittal · left · 3.0mm · 0.62mm/px · 4 of 33 slices shown]
[im 1/33]
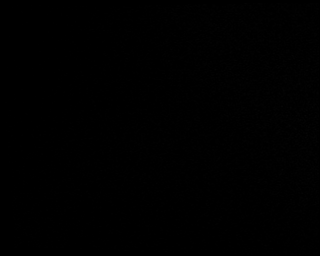
[im 11/33]
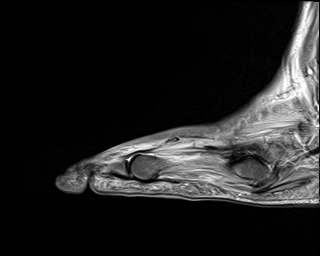
[im 22/33]
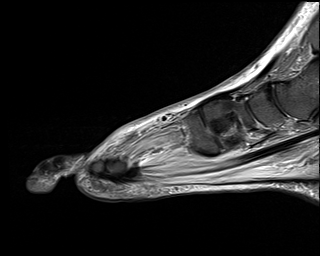
[im 33/33]
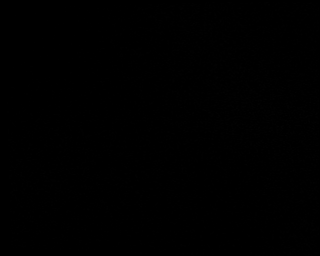

[Series 10: T1 fat-sat · coronal · non-contrast · left · 3.0mm · 0.47mm/px · 6 of 47 slices shown (1 of 3)]
[im 1/47]
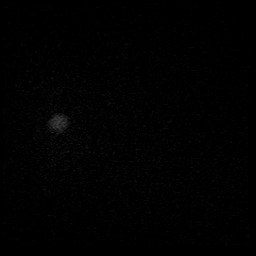
[im 10/47]
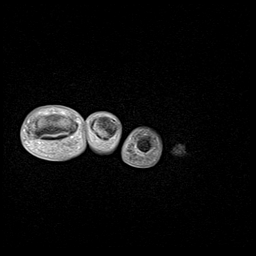
[im 19/47]
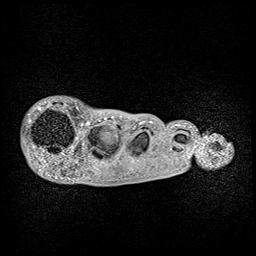
[im 28/47]
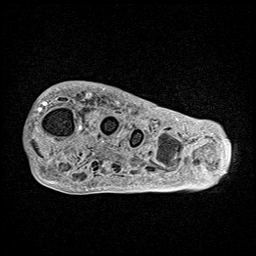
[im 37/47]
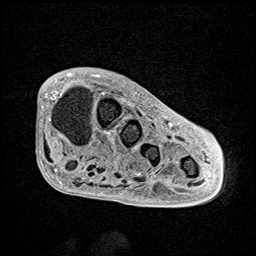
[im 47/47]
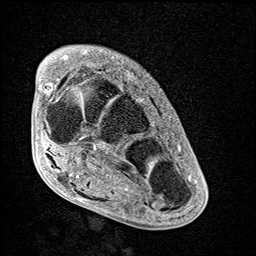

[Series 11: T1 fat-sat post-contrast · coronal · left · 3.0mm · 0.47mm/px · 6 of 47 slices shown]
[im 1/47]
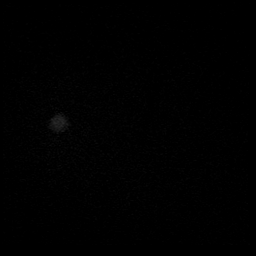
[im 10/47]
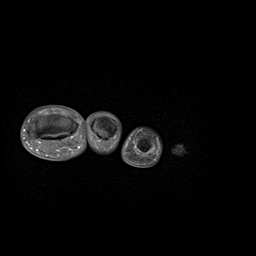
[im 19/47]
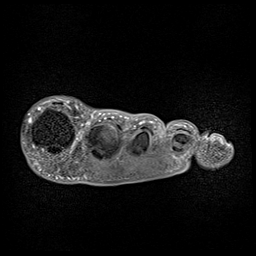
[im 28/47]
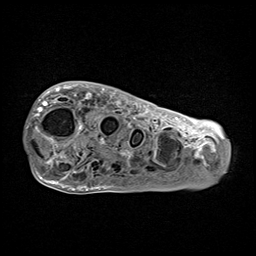
[im 37/47]
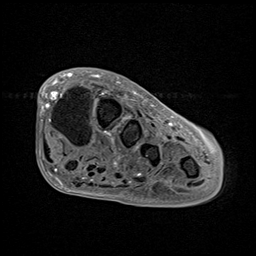
[im 47/47]
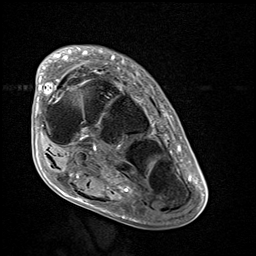

[Series 12: T1 fat-sat · sagittal · left · 3.0mm · 0.62mm/px · 4 of 33 slices shown (2 of 3)]
[im 1/33]
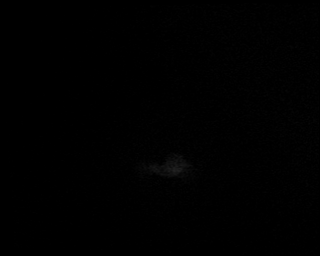
[im 11/33]
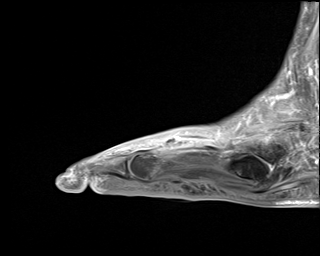
[im 22/33]
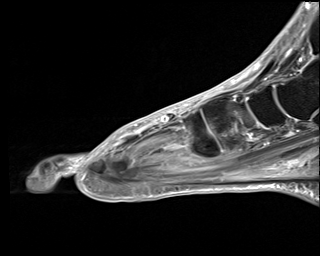
[im 33/33]
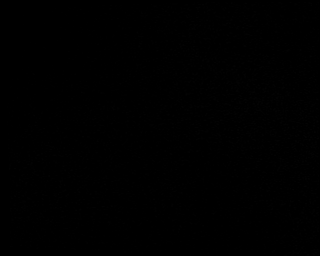

[Series 13: T1 fat-sat · axial · left · 3.0mm · 0.56mm/px · z∈[-104,-34]mm · 2 of 20 slices shown (3 of 3)]
[im 1/20]
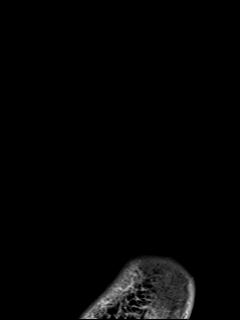
[im 20/20]
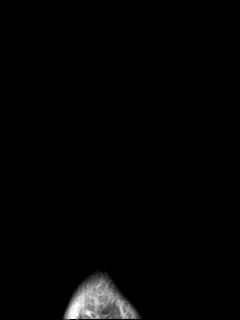

[40 of 40 positions shown; findings below may reference images not displayed]

FINDINGS: Bones/Joint/Cartilage

Abnormal marrow edema and enhancement with corresponding decreased
T1 marrow signal involving the fifth metatarsal head and base of the
fifth proximal phalanx, consistent with osteomyelitis.

No fracture or dislocation. Mild edema within the third and fourth
metatarsal shafts preserved T1 marrow signal, possibly stress
related. Osteoarthritis of the naviculocuneiform and second and
third TMT joints. Small second TMT joint effusion.

Ligaments

Collateral ligaments are intact.  Lisfranc ligament is intact.

Muscles and Tendons
Flexor, peroneal and extensor compartment tendons are intact.
Extensive fatty atrophy of the intrinsic foot muscles.

Soft tissue
Ulceration at the lateral aspect of the fifth metatarsal head
extending to bone. Small area of surrounding devitalized soft tissue
with foci of subcutaneous emphysema. Dorsal lateral forefoot skin
thickening and enhancement. No fluid collection or hematoma. No soft
tissue mass.
IMPRESSION: 1. Ulceration at the lateral aspect of the fifth metatarsal head
extending to bone with underlying osteomyelitis of the fifth
metatarsal head and fifth proximal phalanx.
2. Small area of devitalized soft tissue adjacent to the ulceration.
Cellulitis of the dorsal lateral forefoot. No abscess.

## 2020-12-28 IMAGING — DX DG FOOT COMPLETE 3+V*L*
3 series · 3 of 3 positions shown · non-contrast
Comparison: Preoperative radiograph yesterday

CLINICAL DATA: Postop.

EXAM:
LEFT FOOT - COMPLETE 3+ VIEW

[foot ap]
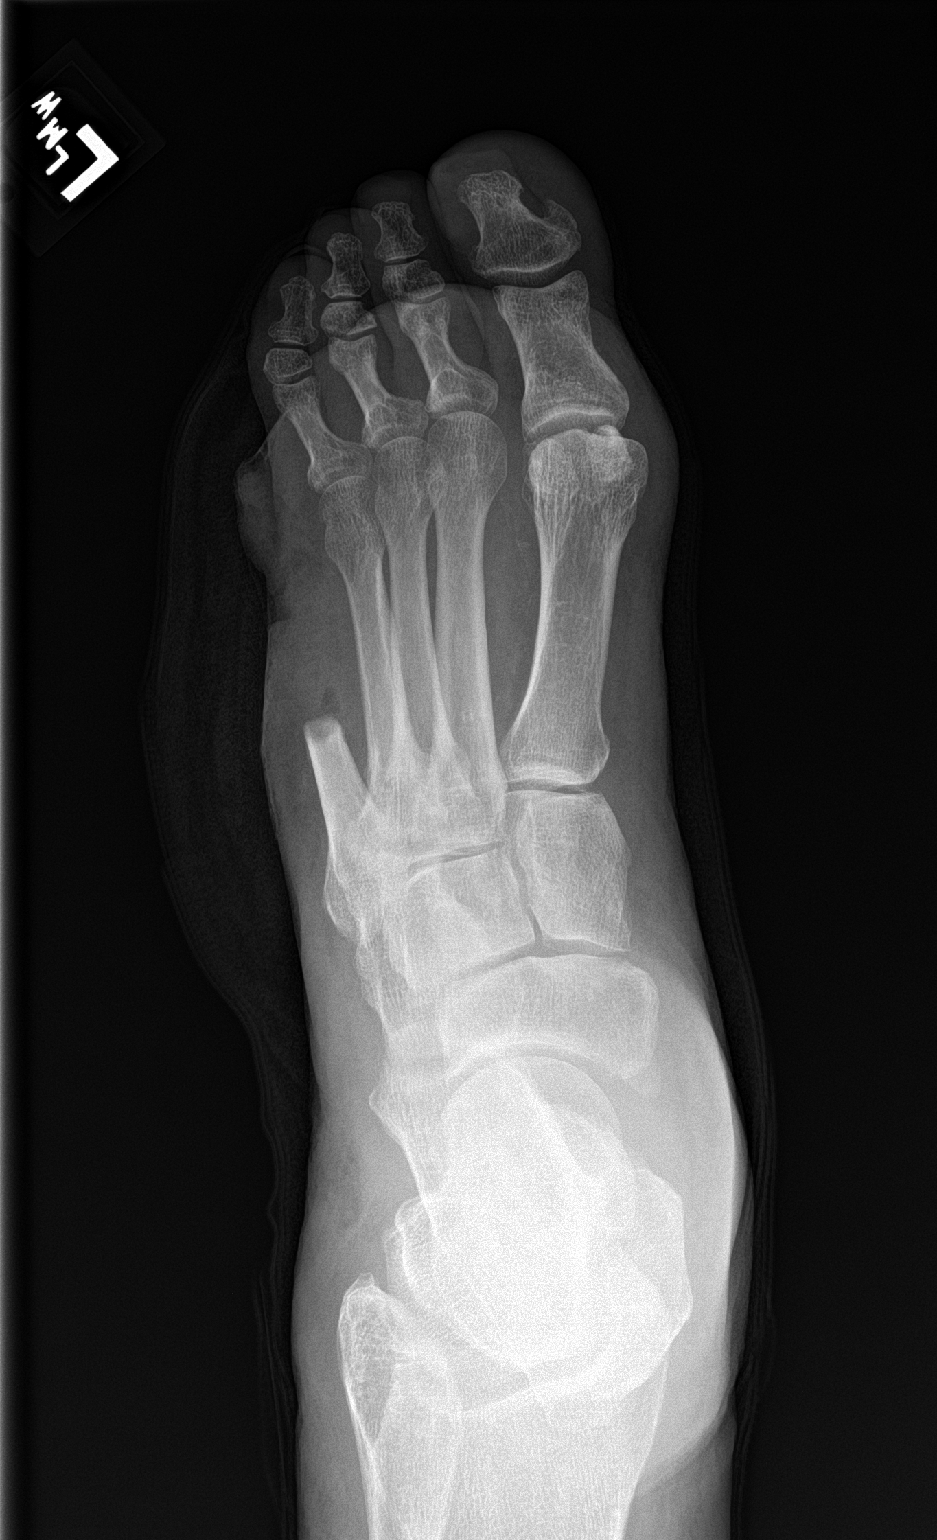

[foot obl]
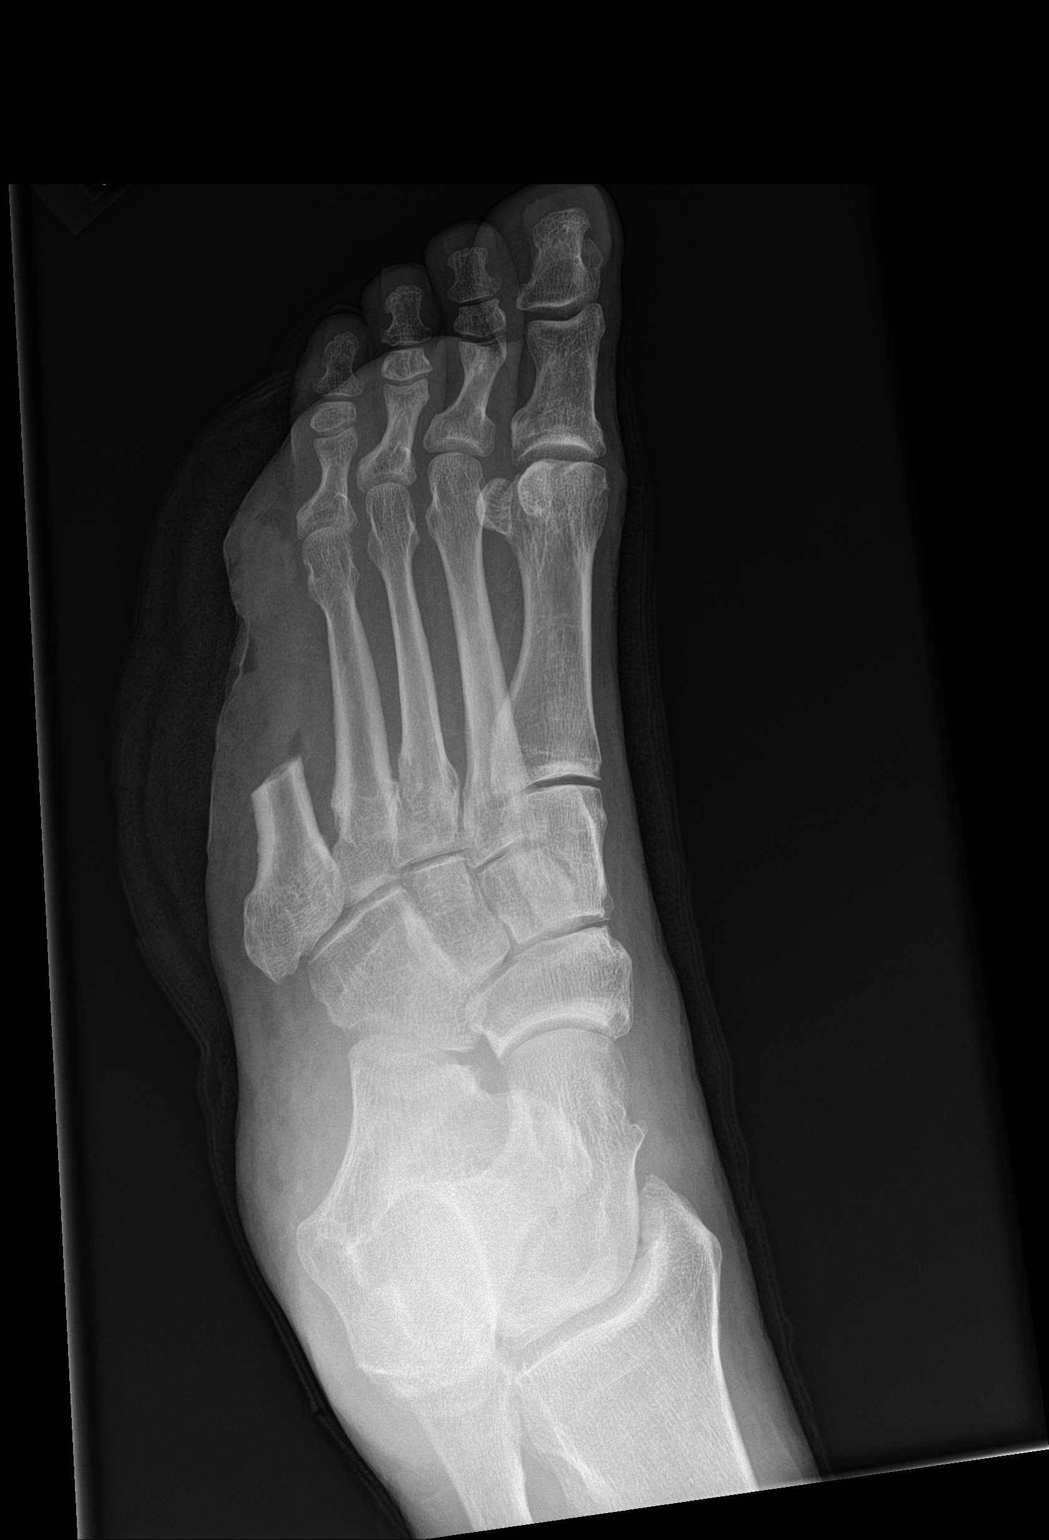

[foot lat]
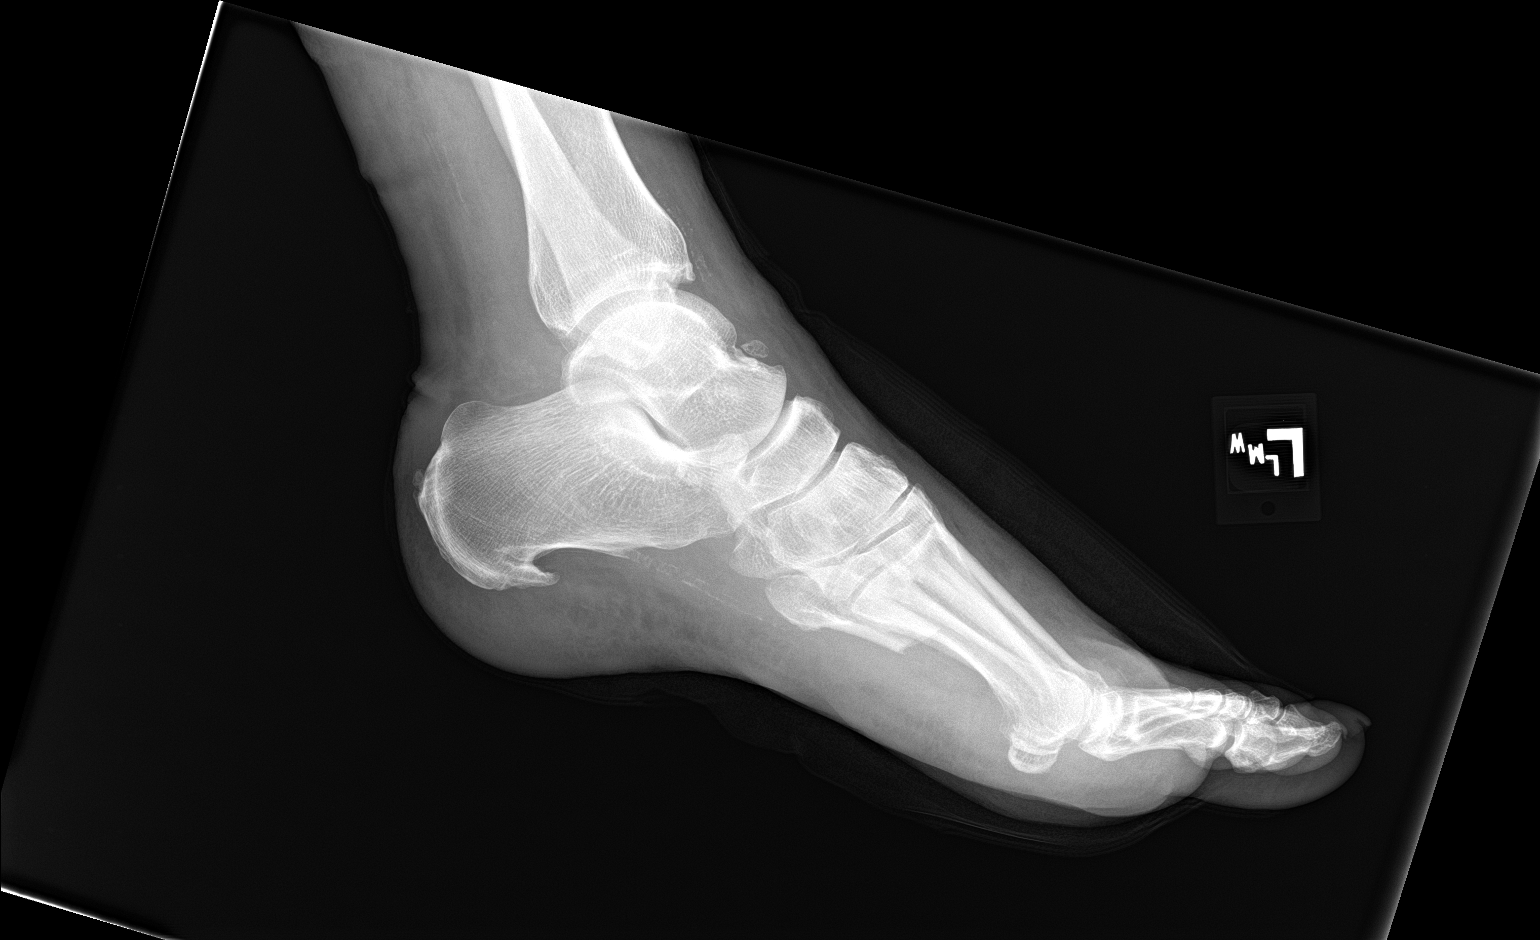

[3 of 3 positions shown; findings below may reference images not displayed]

FINDINGS: Transmetatarsal amputation of the fifth ray. The remainder of the
foot is intact. No evidence of osteomyelitis. Again seen plantar
calcaneal spur and Achilles tendon enthesophyte. Well corticated
density adjacent to the dorsal talus, favor sequela of remote prior
injury. Vascular calcifications.
IMPRESSION: Transmetatarsal amputation of the fifth ray. No immediate
postoperative complication.

## 2023-01-26 ENCOUNTER — Ambulatory Visit
Admission: RE | Admit: 2023-01-26 | Discharge: 2023-01-26 | Disposition: A | Discharge status: Home / Self Care | Payer: 59 | Source: Ambulatory Visit | Attending: Endocrinology | Admitting: Endocrinology

## 2023-01-26 ENCOUNTER — Other Ambulatory Visit: Payer: Self-pay | Admitting: Endocrinology

## 2023-01-26 DIAGNOSIS — Z794 Long term (current) use of insulin: Secondary | ICD-10-CM | POA: Insufficient documentation

## 2023-01-26 DIAGNOSIS — E119 Type 2 diabetes mellitus without complications: Secondary | ICD-10-CM | POA: Insufficient documentation

## 2023-01-26 DIAGNOSIS — M79605 Pain in left leg: Secondary | ICD-10-CM | POA: Diagnosis present

## 2023-01-26 DIAGNOSIS — E1165 Type 2 diabetes mellitus with hyperglycemia: Secondary | ICD-10-CM | POA: Insufficient documentation

## 2023-02-02 DIAGNOSIS — Z89422 Acquired absence of other left toe(s): Secondary | ICD-10-CM | POA: Insufficient documentation

## 2023-03-08 DIAGNOSIS — E538 Deficiency of other specified B group vitamins: Secondary | ICD-10-CM | POA: Insufficient documentation
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
## 2023-03-13 DIAGNOSIS — Z789 Other specified health status: Secondary | ICD-10-CM | POA: Insufficient documentation

## 2023-03-13 DIAGNOSIS — M899 Disorder of bone, unspecified: Secondary | ICD-10-CM | POA: Insufficient documentation

## 2023-03-13 DIAGNOSIS — Z79899 Other long term (current) drug therapy: Secondary | ICD-10-CM | POA: Insufficient documentation

## 2023-03-13 NOTE — Patient Instructions (Signed)

## 2023-03-14 ENCOUNTER — Ambulatory Visit (HOSPITAL_BASED_OUTPATIENT_CLINIC_OR_DEPARTMENT_OTHER): Payer: 59 | Admitting: Pain Medicine

## 2023-03-14 DIAGNOSIS — E538 Deficiency of other specified B group vitamins: Secondary | ICD-10-CM

## 2023-03-14 DIAGNOSIS — Z91199 Patient's noncompliance with other medical treatment and regimen due to unspecified reason: Secondary | ICD-10-CM

## 2023-03-14 DIAGNOSIS — G894 Chronic pain syndrome: Secondary | ICD-10-CM

## 2023-03-14 DIAGNOSIS — G8929 Other chronic pain: Secondary | ICD-10-CM

## 2023-03-14 DIAGNOSIS — Z789 Other specified health status: Secondary | ICD-10-CM

## 2023-03-14 DIAGNOSIS — Z79899 Other long term (current) drug therapy: Secondary | ICD-10-CM

## 2023-03-14 DIAGNOSIS — M899 Disorder of bone, unspecified: Secondary | ICD-10-CM

## 2023-04-12 ENCOUNTER — Encounter (INDEPENDENT_AMBULATORY_CARE_PROVIDER_SITE_OTHER): Payer: 59 | Admitting: Nurse Practitioner

## 2024-06-04 ENCOUNTER — Other Ambulatory Visit: Payer: Self-pay | Admitting: Emergency Medicine

## 2024-06-04 DIAGNOSIS — Z122 Encounter for screening for malignant neoplasm of respiratory organs: Secondary | ICD-10-CM

## 2024-06-04 DIAGNOSIS — F17210 Nicotine dependence, cigarettes, uncomplicated: Secondary | ICD-10-CM

## 2024-06-14 ENCOUNTER — Ambulatory Visit: Admission: RE | Admit: 2024-06-14 | Discharge: 2024-06-14 | Attending: Emergency Medicine

## 2024-06-14 DIAGNOSIS — Z122 Encounter for screening for malignant neoplasm of respiratory organs: Secondary | ICD-10-CM | POA: Diagnosis present

## 2024-06-14 DIAGNOSIS — F17210 Nicotine dependence, cigarettes, uncomplicated: Secondary | ICD-10-CM | POA: Diagnosis present

## 2024-06-20 ENCOUNTER — Other Ambulatory Visit: Payer: Self-pay | Admitting: Emergency Medicine

## 2024-06-20 DIAGNOSIS — R9389 Abnormal findings on diagnostic imaging of other specified body structures: Secondary | ICD-10-CM

## 2024-06-20 DIAGNOSIS — R918 Other nonspecific abnormal finding of lung field: Secondary | ICD-10-CM

## 2024-06-27 ENCOUNTER — Ambulatory Visit
Admission: RE | Admit: 2024-06-27 | Discharge: 2024-06-27 | Disposition: A | Discharge status: Home / Self Care | Source: Ambulatory Visit | Attending: Emergency Medicine | Admitting: Emergency Medicine

## 2024-06-27 DIAGNOSIS — J439 Emphysema, unspecified: Secondary | ICD-10-CM | POA: Insufficient documentation

## 2024-06-27 DIAGNOSIS — I700 Atherosclerosis of aorta: Secondary | ICD-10-CM | POA: Insufficient documentation

## 2024-06-27 DIAGNOSIS — R9389 Abnormal findings on diagnostic imaging of other specified body structures: Secondary | ICD-10-CM | POA: Insufficient documentation

## 2024-06-27 DIAGNOSIS — R918 Other nonspecific abnormal finding of lung field: Secondary | ICD-10-CM | POA: Insufficient documentation

## 2024-06-27 LAB — GLUCOSE, CAPILLARY: Glucose-Capillary: 185 mg/dL — ABNORMAL HIGH (ref 70–99)

## 2024-06-27 MED ORDER — FLUDEOXYGLUCOSE F - 18 (FDG) INJECTION
9.5000 | Freq: Once | INTRAVENOUS | Status: AC | PRN
  Administered 2024-06-27: 9.72 via INTRAVENOUS

## 2024-07-04 ENCOUNTER — Other Ambulatory Visit: Payer: Self-pay | Admitting: Pulmonary Disease

## 2024-07-09 ENCOUNTER — Other Ambulatory Visit: Payer: Self-pay | Admitting: Pulmonary Disease

## 2024-07-09 DIAGNOSIS — R911 Solitary pulmonary nodule: Secondary | ICD-10-CM

## 2024-07-09 DIAGNOSIS — J432 Centrilobular emphysema: Secondary | ICD-10-CM

## 2024-07-11 ENCOUNTER — Ambulatory Visit
Admission: RE | Admit: 2024-07-11 | Discharge: 2024-07-11 | Disposition: A | Discharge status: Home / Self Care | Source: Ambulatory Visit | Attending: Pulmonary Disease | Admitting: Pulmonary Disease

## 2024-07-11 ENCOUNTER — Other Ambulatory Visit: Payer: Self-pay

## 2024-07-11 VITALS — Ht 69.0 in | Wt 170.0 lb

## 2024-07-11 DIAGNOSIS — I700 Atherosclerosis of aorta: Secondary | ICD-10-CM | POA: Diagnosis not present

## 2024-07-11 DIAGNOSIS — J432 Centrilobular emphysema: Secondary | ICD-10-CM | POA: Insufficient documentation

## 2024-07-11 DIAGNOSIS — R911 Solitary pulmonary nodule: Secondary | ICD-10-CM | POA: Insufficient documentation

## 2024-07-11 DIAGNOSIS — I2510 Atherosclerotic heart disease of native coronary artery without angina pectoris: Secondary | ICD-10-CM | POA: Diagnosis not present

## 2024-07-11 DIAGNOSIS — Z01818 Encounter for other preprocedural examination: Secondary | ICD-10-CM | POA: Insufficient documentation

## 2024-07-11 DIAGNOSIS — Z01810 Encounter for preprocedural cardiovascular examination: Secondary | ICD-10-CM | POA: Diagnosis not present

## 2024-07-11 NOTE — Patient Instructions (Addendum)
 Your procedure is scheduled on: FRIDAY 07/13/24 Report to the Registration Desk on the 1st floor of the [facility]. To find out your arrival time, please call [phone] between 1PM - 3PM on: THURSDAY 07/12/24 If your arrival time is 6:00 am, do not arrive before that time as the [facility] entrance doors do not open until 6:00 am.  REMEMBER: Instructions that are not followed completely may result in serious medical risk, up to and including death; or upon the discretion of your surgeon and anesthesiologist your surgery may need to be rescheduled.  Do not eat food after midnight the night before surgery.  No gum chewing or hard candies.  You may however, drink WATER up to 2 hours before you are scheduled to arrive for your surgery. Do not drink anything within 2 hours of your scheduled arrival time.  One week prior to surgery: Stop Anti-inflammatories (NSAIDS) such as Advil, Aleve, Ibuprofen, Motrin, Naproxen, Naprosyn and Aspirin based products such as Excedrin, Goody's Powder, BC Powder. Stop ANY OVER THE COUNTER supplements until after surgery.  STOP empagliflozin (JARDIANCE)  3 days before surgery.   You may however, continue to take Tylenol  if needed for pain up until the day of surgery.  Continue taking all of your other prescription medications up until the day of surgery.   Take 1/2 normal dose of insulin  glargine (LANTUS ) the night before surgery.   ON THE DAY OF SURGERY ONLY TAKE THESE MEDICATIONS WITH SIPS OF WATER:  amLODipine  (NORVASC ) atorvastatin  metoprolol   Use inhalers on the day of surgery and bring to the hospital.  No Alcohol for 24 hours before or after surgery.  No Smoking including e-cigarettes for 24 hours before surgery.  No chewable tobacco products for at least 6 hours before surgery.  No nicotine  patches on the day of surgery.  Do not use any recreational drugs for at least a week (preferably 2 weeks) before your surgery.  Please be advised  that the combination of cocaine and anesthesia may have negative outcomes, up to and including death. If you test positive for cocaine, your surgery will be cancelled.  On the morning of surgery brush your teeth with toothpaste and water, you may rinse your mouth with mouthwash if you wish. Do not swallow any toothpaste or mouthwash.  Use CHG Soap or wipes as directed on instruction sheet.  Do not wear jewelry, make-up, hairpins, clips or nail polish.  For welded (permanent) jewelry: bracelets, anklets, waist bands, etc.  Please have this removed prior to surgery.  If it is not removed, there is a chance that hospital personnel will need to cut it off on the day of surgery.  Do not wear lotions, powders, or perfumes.   Do not shave body hair from the neck down 48 hours before surgery.  Contact lenses, hearing aids and dentures may not be worn into surgery.  Do not bring valuables to the hospital. [facility] is not responsible for any missing/lost belongings or valuables.   Bring your C-PAP to the hospital in case you may have to spend the night.   Notify your doctor if there is any change in your medical condition (cold, fever, infection).  Wear comfortable clothing (specific to your surgery type) to the hospital.  After surgery, you can help prevent lung complications by doing breathing exercises.  Take deep breaths and cough every 1-2 hours. Your doctor may order a device called an Incentive Spirometer to help you take deep breaths.  If you  are being discharged the day of surgery, you will not be allowed to drive home. You will need a responsible individual to drive you home and stay with you for 24 hours after surgery.   If you are taking public transportation, you will need to have a responsible individual with you.  Please call the Pre-admissions Testing Dept. at [phone] if you have any questions about these instructions.  Surgery Visitation Policy:  Patients having  surgery or a procedure may have two visitors.  Children under the age of 75 must have an adult with them who is not the patient.  Merchandiser, Retail to address health-related social needs:  [email or web address]

## 2024-07-13 ENCOUNTER — Ambulatory Visit

## 2024-07-13 ENCOUNTER — Encounter
Admission: RE | Disposition: A | Discharge status: Home / Self Care | Payer: Self-pay | Source: Home / Self Care | Attending: Pulmonary Disease

## 2024-07-13 ENCOUNTER — Encounter: Payer: Self-pay | Admitting: Pulmonary Disease

## 2024-07-13 ENCOUNTER — Ambulatory Visit
Admission: RE | Admit: 2024-07-13 | Discharge: 2024-07-13 | Disposition: A | Discharge status: Home / Self Care | Attending: Pulmonary Disease | Admitting: Pulmonary Disease

## 2024-07-13 ENCOUNTER — Other Ambulatory Visit: Payer: Self-pay

## 2024-07-13 DIAGNOSIS — Z01818 Encounter for other preprocedural examination: Secondary | ICD-10-CM

## 2024-07-13 DIAGNOSIS — R911 Solitary pulmonary nodule: Secondary | ICD-10-CM

## 2024-07-13 HISTORY — PX: VIDEO BRONCHOSCOPY WITH ENDOBRONCHIAL ULTRASOUND: SHX6177

## 2024-07-13 HISTORY — PX: VIDEO BRONCHOSCOPY: SHX5072

## 2024-07-13 LAB — GLUCOSE, CAPILLARY
Glucose-Capillary: 167 mg/dL — ABNORMAL HIGH (ref 70–99)
Glucose-Capillary: 139 mg/dL — ABNORMAL HIGH (ref 70–99)

## 2024-07-13 SURGERY — BRONCHOSCOPY, VIDEO-ASSISTED
Anesthesia: General

## 2024-07-13 MED ORDER — EPHEDRINE SULFATE-NACL 50-0.9 MG/10ML-% IV SOSY
PREFILLED_SYRINGE | INTRAVENOUS | Status: DC | PRN
  Administered 2024-07-13: 5 mg via INTRAVENOUS

## 2024-07-13 MED ORDER — FENTANYL CITRATE (PF) 100 MCG/2ML IJ SOLN
INTRAMUSCULAR | Status: DC | PRN
  Administered 2024-07-13 (×2): 50 ug via INTRAVENOUS

## 2024-07-13 MED ORDER — CHLORHEXIDINE GLUCONATE 0.12 % MT SOLN
OROMUCOSAL | Status: AC
  Filled 2024-07-13: qty 15

## 2024-07-13 MED ORDER — GLYCOPYRROLATE 0.2 MG/ML IJ SOLN
INTRAMUSCULAR | Status: DC | PRN
  Administered 2024-07-13: .2 mg via INTRAVENOUS

## 2024-07-13 MED ORDER — ORAL CARE MOUTH RINSE: 15.0000 mL | Freq: Once | OROMUCOSAL | Status: AC

## 2024-07-13 MED ORDER — PROPOFOL 10 MG/ML IV BOLUS
INTRAVENOUS | Status: DC | PRN
  Administered 2024-07-13: 200 mg via INTRAVENOUS
  Administered 2024-07-13: 125 ug/kg/min via INTRAVENOUS

## 2024-07-13 MED ORDER — ROCURONIUM BROMIDE 10 MG/ML (PF) SYRINGE
PREFILLED_SYRINGE | INTRAVENOUS | Status: AC
  Filled 2024-07-13: qty 20

## 2024-07-13 MED ORDER — SUGAMMADEX SODIUM 200 MG/2ML IV SOLN
INTRAVENOUS | Status: DC | PRN
  Administered 2024-07-13: 200 mg via INTRAVENOUS

## 2024-07-13 MED ORDER — FENTANYL CITRATE (PF) 100 MCG/2ML IJ SOLN
INTRAMUSCULAR | Status: AC
  Filled 2024-07-13: qty 2

## 2024-07-13 MED ORDER — DEXAMETHASONE SOD PHOSPHATE PF 10 MG/ML IJ SOLN
INTRAMUSCULAR | Status: DC | PRN
  Administered 2024-07-13: 10 mg via INTRAVENOUS

## 2024-07-13 MED ORDER — ROCURONIUM BROMIDE 10 MG/ML (PF) SYRINGE
PREFILLED_SYRINGE | INTRAVENOUS | Status: AC
  Filled 2024-07-13: qty 10

## 2024-07-13 MED ORDER — SODIUM CHLORIDE 0.9 % IV SOLN: INTRAVENOUS | Status: DC

## 2024-07-13 MED ORDER — MIDAZOLAM HCL (PF) 2 MG/2ML IJ SOLN
INTRAMUSCULAR | Status: DC | PRN
  Administered 2024-07-13: 2 mg via INTRAVENOUS

## 2024-07-13 MED ORDER — MIDAZOLAM HCL 2 MG/2ML IJ SOLN
INTRAMUSCULAR | Status: AC
  Filled 2024-07-13: qty 2

## 2024-07-13 MED ORDER — PROPOFOL 10 MG/ML IV BOLUS
INTRAVENOUS | Status: AC
  Filled 2024-07-13: qty 20

## 2024-07-13 MED ORDER — LIDOCAINE HCL (CARDIAC) PF 100 MG/5ML IV SOSY
PREFILLED_SYRINGE | INTRAVENOUS | Status: DC | PRN
  Administered 2024-07-13: 100 mg via INTRAVENOUS

## 2024-07-13 MED ORDER — ROCURONIUM BROMIDE 100 MG/10ML IV SOLN
INTRAVENOUS | Status: DC | PRN
  Administered 2024-07-13: 50 mg via INTRAVENOUS

## 2024-07-13 MED ORDER — EPHEDRINE 5 MG/ML INJ
INTRAVENOUS | Status: AC
  Filled 2024-07-13: qty 5

## 2024-07-13 MED ORDER — CHLORHEXIDINE GLUCONATE 0.12 % MT SOLN
15.0000 mL | Freq: Once | OROMUCOSAL | Status: AC
  Administered 2024-07-13: 15 mL via OROMUCOSAL

## 2024-07-13 NOTE — Transfer of Care (Signed)
 Immediate Anesthesia Transfer of Care Note  Patient: Edward Richard  Procedure(s) Performed: BRONCHOSCOPY, VIDEO-ASSISTED BRONCHOSCOPY, WITH EBUS  Patient Location: PACU  Anesthesia Type:General  Level of Consciousness: awake, alert , and oriented  Airway & Oxygen Therapy: Patient Spontanous Breathing and Patient connected to face mask oxygen  Post-op Assessment: Report given to RN and Post -op Vital signs reviewed and stable  Post vital signs: Reviewed and stable  Last Vitals:  Vitals Value Taken Time  BP 143/71   Temp    Pulse 83 07/13/24 15:06  Resp 11 07/13/24 15:06  SpO2 99 % 07/13/24 15:06  Vitals shown include unfiled device data.  Last Pain:  Vitals:   07/13/24 1113  TempSrc: Oral  PainSc: 0-No pain         Complications: There were no known notable events for this encounter.

## 2024-07-13 NOTE — H&P (Signed)
 "    PULMONOLOGY         Date: 07/13/2024,   MRN# [id number] Edward Richard Feb 26, 1956     AdmissionWeight: 77.1 kg                 CurrentWeight: 77.1 kg  Referring provider: Dr Theotis    CHIEF COMPLAINT:   Right lowe lobe nodule with mucus plugging    HISTORY OF PRESENT ILLNESS   This is a 69 year old male with a history of diabetes, dyslipidemia, hypertension, previous MI and COPD with lifelong history of smoking who was seen by primary care and was noted to have incidentally found right lower lobe nodule.  This was discussed with patient and was followed with a PET scan study and was found to be hypermetabolic.  Patient is here today for bronchoscopic airway examination with flexible bronchoscopy and therapeutic aspiration of tracheobronchial tree for any noted mucous plugging as well as bronchoalveolar lavage to rule out infectious etiology.  This will be followed by lung biopsy utilizing the Ion robotic platform for lung biopsy.  Will be using radial endobronchial ultrasound as well as 3D fluoroscopy for confirmation of lesion prior to biopsy.  Lastly we will finish up with endobronchial ultrasound lymph node staging.  Patient does not report any new findings and wishes to proceed as planned.  Reviewed risks/complications and benefits with patient, risks include infection, pneumothorax/pneumomediastinum which may require chest tube placement as well as overnight/prolonged hospitalization and possible mechanical ventilation. Other risks include bleeding and very rarely death.  Patient understands risks and wishes to proceed.  Additional questions were answered, and patient is aware that post procedure patient will be going home with family and may experience cough with possible clots on expectoration as well as phlegm which may last few days as well as hoarseness of voice post intubation and mechanical ventilation.    PAST MEDICAL HISTORY   Past Medical History:  Diagnosis  Date   COPD (chronic obstructive pulmonary disease) (HCC)    Diabetes mellitus without complication (HCC)    Hypercholesteremia    Hypertension    Myocardial infarction [facility])      SURGICAL HISTORY   Past Surgical History:  Procedure Laterality Date   AMPUTATION Left 12/11/2019   Procedure: AMPUTATION RAY 5TH RAY PARTIAL;  Surgeon: Lennie Barter, DPM;  Location: [facility] ORS;  Service: Podiatry;  Laterality: Left;   INCISION AND DRAINAGE Left 12/11/2019   Procedure: INCISION AND DRAINAGE;  Surgeon: Lennie Barter, DPM;  Location: [facility] OR[facility];  Service: Podiatry;  Laterality: Left;   LOWER EXTREMITY ANGIOGRAPHY Left 12/11/2019   Procedure: Lower Extremity Angiography;  Surgeon: Jama Cordella MATSU, MD;  Location: [facility] IN[facility]IVE [facility];  Service: Cardiovascular;  Laterality: Left;   NO PAST SURGERIES     TEE WITHOUT CARDIOVERSION N/A 12/13/2019   Procedure: TRANSESOPHAGEAL ECHOCARDIOGRAM (TEE);  Surgeon: Bosie Vinie LABOR, MD;  Location: [facility] ORS;  Service: Cardiovascular;  Laterality: N/A;     FAMILY HISTORY   Family History  Problem Relation Age of Onset   Cancer Mother    Heart failure Father    Cancer Father      SOCIAL HISTORY   Social History[1]   MEDICATIONS    Home Medication:    Current Medication: Current Medications[2]    ALLERGIES   Patient has no known allergies.     REVIEW OF SYSTEMS    Review of Systems:  Gen:  Denies  fever, sweats, chills weigh loss  HEENT: Denies blurred vision, double  vision, ear pain, eye pain, hearing loss, nose bleeds, sore throat Cardiac:  No dizziness, chest pain or heaviness, chest tightness,edema Resp:   reports dyspnea chronically  Gi: Denies swallowing difficulty, stomach pain, nausea or vomiting, diarrhea, constipation, bowel incontinence Gu:  Denies bladder incontinence, burning urine Ext:   Denies Joint pain, stiffness or swelling Skin: Denies  skin rash, easy bruising or bleeding or hives Endoc:  Denies polyuria,  polydipsia , polyphagia or weight change Psych:   Denies depression, insomnia or hallucinations   Other:  All other systems negative   VS: BP (!) 196/86 (BP Location: Right Arm)   Pulse 81   Temp 98.2 F (36.8 C) (Oral)   Resp 16   Ht 5' 9 (1.753 m)   Wt 77.1 kg   SpO2 99%   BMI 25.10 kg/m      PHYSICAL EXAM    GENERAL:NAD, no fevers, chills, no weakness no fatigue HEAD: Normocephalic, atraumatic.  EYES: Pupils equal, round, reactive to light. Extraocular muscles intact. No scleral icterus.  MOUTH: Moist mucosal membrane. Dentition intact. No abscess noted.  EAR, NOSE, THROAT: Clear without exudates. No external lesions.  NECK: Supple. No thyromegaly. No nodules. No JVD.  PULMONARY: decreased breath sounds with mild rhonchi worse at bases bilaterally.  CARDIOVASCULAR: S1 and S2. Regular rate and rhythm. No murmurs, rubs, or gallops. No edema. Pedal pulses 2+ bilaterally.  GASTROINTESTINAL: Soft, nontender, nondistended. No masses. Positive bowel sounds. No hepatosplenomegaly.  MUSCULOSKELETAL: No swelling, clubbing, or edema. Range of motion full in all extremities.  NEUROLOGIC: Cranial nerves II through XII are intact. No gross focal neurological deficits. Sensation intact. Reflexes intact.  SKIN: No ulceration, lesions, rashes, or cyanosis. Skin warm and dry. Turgor intact.  PSYCHIATRIC: Mood, affect within normal limits. The patient is awake, alert and oriented x 3. Insight, judgment intact.       IMAGING   @IMAGES @  Narrative & Impression CLINICAL DATA:  One hundred thirty-five pack-year history of smoking with recent lung cancer screening CT demonstrating 2.1 cm suspicious nodule over the medial right lower lobe.   EXAM: CT CHEST WITHOUT CONTRAST   TECHNIQUE: Multidetector CT imaging of the chest was performed using thin slice collimation for electromagnetic bronchoscopy planning purposes, without intravenous contrast.   RADIATION DOSE REDUCTION: This exam  was performed according to the departmental dose-optimization program which includes automated exposure control, adjustment of the mA and/or kV according to patient size and/or use of iterative reconstruction technique.   COMPARISON:  Chest CT 06/14/2024 and PET-CT 06/27/2024   FINDINGS: Cardiovascular: Heart is normal size. Calcified plaque over the left main and 3 vessel coronary arteries. Thoracic aorta is normal in caliber. Pulmonary arterial system and remaining vascular structures are unremarkable on this noncontrast exam.   Mediastinum/Nodes: No concerning mediastinal or hilar adenopathy. Remaining mediastinal structures are unremarkable.   Lungs/Pleura: Lungs are adequately inflated without acute airspace process or effusion. Evidence patient's known irregular nodule over the medial right lower lobe (image 105) measuring 1.2 x 2.2 cm unchanged from recent prior exams and noted to be hypermetabolic on recent PET-CT. No other concerning pulmonary nodules or masses. Airways are normal.   Upper Abdomen: Upper abdominal structures unchanged.   Musculoskeletal: No focal abnormality.   IMPRESSION: 1. Stable 1.2 x 2.2 cm irregular nodule over the medial right lower lobe unchanged from recent prior exams and noted to be hypermetabolic on recent PET-CT. 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 2. No concerning mediastinal or hilar adenopathy. 3. Aortic atherosclerosis.  Atherosclerotic coronary artery disease.   Aortic Atherosclerosis (ICD10-I70.0).     Electronically Signed   By: Toribio Agreste M.D.   On: 07/11/2024 09:32  ASSESSMENT/PLAN   Right lower lobe nodule with mucus plugging and hilar adenopathy    -  Patient is here today for bronchoscopic airway examination with flexible bronchoscopy and therapeutic aspiration of tracheobronchial tree for any noted mucous plugging as well as bronchoalveolar lavage to rule out infectious etiology.  This will be  followed by lung biopsy utilizing the Ion robotic platform for lung biopsy.  Will be using radial endobronchial ultrasound as well as 3D fluoroscopy for confirmation of lesion prior to biopsy.  Lastly we will finish up with endobronchial ultrasound lymph node staging.  Patient does not report any new findings and wishes to proceed as planned.  Reviewed risks/complications and benefits with patient, risks include infection, pneumothorax/pneumomediastinum which may require chest tube placement as well as overnight/prolonged hospitalization and possible mechanical ventilation. Other risks include bleeding and very rarely death.  Patient understands risks and wishes to proceed.  Additional questions were answered, and patient is aware that post procedure patient will be going home with family and may experience cough with possible clots on expectoration as well as phlegm which may last few days as well as hoarseness of voice post intubation and mechanical ventilation.         Thank you for allowing me to participate in the care of this patient.   Patient/Family are satisfied with care plan and all questions have been answered.    Provider disclosure: Patient with at least one acute or chronic illness or injury that poses a threat to life or bodily function and is being managed actively during this encounter.  All of the below services have been performed independently by signing provider:  review of prior documentation from internal and or external health records.  Review of previous and current lab results.  Interview and comprehensive assessment during patient visit today. Review of current and previous chest radiographs/CT scans. Discussion of management and test interpretation with health care team and patient/family.   This document was prepared using Dragon voice recognition software and may include unintentional dictation errors.     Virgil Lightner, M.D.  Division of Pulmonary & Critical Care  Medicine             [1]  Social History Tobacco Use   Smoking status: Some Days    Current packs/day: 0.50    Types: Cigarettes   Smokeless tobacco: Never   Tobacco comments:    has went down to .5 pack day   Substance Use Topics   Alcohol use: Yes   Drug use: No  [2]  Current Facility-Administered Medications:    0.9 %  sodium chloride  infusion, , Intravenous, Continuous, Vicci Camellia Glatter, MD, Last Rate: 10 mL/hr at 07/13/24 1221, New Bag at 07/13/24 1221  "

## 2024-07-13 NOTE — Anesthesia Procedure Notes (Signed)
 Procedure Name: Intubation Date/Time: 07/13/2024 2:05 PM  Performed by: Bonnetta Jimmey SAUNDERS, CRNAPre-anesthesia Checklist: Patient identified, Emergency Drugs available, Suction available and Patient being monitored Patient Re-evaluated:Patient Re-evaluated prior to induction Oxygen Delivery Method: Circle system utilized Preoxygenation: Pre-oxygenation with 100% oxygen Induction Type: IV induction Ventilation: Mask ventilation without difficulty Laryngoscope Size: McGrath and 4 Grade View: Grade I Tube type: Oral Number of attempts: 1 Airway Equipment and Method: Stylet and Oral airway Placement Confirmation: ETT inserted through vocal cords under direct vision, positive ETCO2 and breath sounds checked- equal and bilateral Secured at: 20 cm Tube secured with: Tape Dental Injury: Teeth and Oropharynx as per pre-operative assessment

## 2024-07-13 NOTE — Procedures (Signed)
 ROBOTIC NAVIGATIONAL BRONCHOSCOPY PROCEDURE NOTE 31627  FIBEROPTIC BRONCHOSCOPY WITH BRONCHOALVEOLAR LAVAGE PROCEDURE NOTE 31624  THERAPEUTIC ASPIRATION OF TRACHEOBRONCHIAL TREE 31645   ENDOBRONCHIAL ULTRASOUND X >/ 1 LYMPH NODE PROCEDURE NOTE 31652   TRANSBRONCHIAL FNA X 1 LOBE 31629  TRANSBRONCHIAL SURGICAL LUNG BIOPSY X 1 LOBE 31628  RADIAL ENDOBRONCHIAL [id number]  TRANSBRONCHIAL CYTOPATHOLOGY BRUSHINGS [id number]    Flexible bronchoscopy was performed  by : Parris MD  assistance by : 1)Repiratory therapist  and 2)cytotech staff and 3) Anesthesia team and 4) Flouroscopy team and 5) IT supporting staff for robotic platform   Indication for the procedure was :  Pre-procedural H&P. The following assessment was performed on the day of the procedure prior to initiating sedation History:  Chest pain n Dyspnea y Hemoptysis n Cough y Fever n Other pertinent items n  Examination Vital signs -reviewed as per nursing documentation today Cardiac    Murmurs: n  Rubs : n  Gallop: n Lungs Wheezing: n Rales : n Rhonchi :y  Other pertinent findings: SOB/hypoxemia due to chronic lung disease   Pre-procedural assessment for Procedural Sedation included: Depth of sedation: As per anesthesia team  ASA Classification:  2 Mallampati airway assessment: 3    Medication list reviewed: y  The patients interval history was taken and revealed: no new complaints The pre- procedure physical examination revealed: No new findings Refer to prior clinic note for details.  Informed Consent: Informed consent was obtained from:  patient after explanation of procedure and risks, benefits, as well as alternative procedures available.  Explanation of level of sedation and possible transfusion was also provided.    Procedural Preparation: Time out was performed and patient was identified by name and birthdate and procedure to be performed and side for sampling, if any, was  specified. Pt was intubated by anesthesia.  The patient was appropriately draped.   Fiberoptic bronchoscopy with airway inspection, therapeutic aspiration of tracheobronchial tree and BAL Procedure findings:  Bronchoscope was inserted via ETT  without difficulty.  Posterior oropharynx, epiglottis, arytenoids, false cords and vocal cords were not visualized as these were bypassed by endotracheal tube. The distal trachea was normal in circumference and appearance without mucosal, cartilaginous or branching abnormalities.  The main carina was mildly splayed . All right and left lobar airways were NOT visualized to the Subsegmental level due to obstructed airways from phlegm and mucus  plugging. Mucus plugging with partial to complete airway obstruction was noted bilaterally and treated with therapeutic aspiration prior to beginning of robotic bronchoscopy to allow adequate visualization and accurate lung biopsy and improve patients respiratory status.   The mucosa was : friable at target segment  Airways were notable for:        exophytic lesions :n       extrinsic compression in the following distributions: n.       Friable mucosa: y       Teacher, music /pigmentation: n     Post procedure Diagnosis:   Mucus plugging of tracheobronchial tree      Navigational Bronchoscopy Procedure Findings:   Ion robotic platform utilized for this procedure. Post appropriate planning and registration peripheral navigation was used to visualize target lesion.   Radial endobronchial US  technology utilized during this procedure for confirmation of lesion.  3D Fluoroscopy utilized for tool in lesion confirmation    Target 1 RIGHT LOWER LOBE - Transbronchial cytobrushing x  1,   Transbronchial FNA x 4  BAL was performed at this location and sent for processing  with cytology and microbiology    Post procedure diagnosis:  PLEURAL BASED RIGHT LOWER LOBE NODULE      Endobronchial ultrasound  assisted hilar and mediastinal lymph node biopsies procedure findings: The fiberoptic bronchoscope was removed and the EBUS scope was introduced. Examination began to evaluate for pathologically enlarged lymph nodes starting on the LEFT  side progressing to the RIGHTside.  All lymph node biopsies performed with 21 needle. Lymph node biopsies were sent in cytolite for all stations.  STATION 10L - NOT BIOPSIED STATION 7 - 1.2 CM BIOPSIED 3 TIMES STATION 4R- NOT BIOPSIED STATION 10R - NOT BIOPSIED    Post procedure diagnosis:  SUBCARINAL ADENOPATHY CONCERNING FOR METASTASIS     Immediate sampling complications included:NONE immediate  Epinephrine ZERO ml was used topically  The bronchoscopy was terminated due to completion of the planned procedure and the bronchoscope was removed.   Total dosage of Lidocaine  was ZERO mg  Estimated Blood loss: EXPECTED < 5cc.  Complications included:  NONE   Preliminary CXR findings :  IN PROCESS  Disposition: HOME WITH FAMILY   Follow up with Dr. Doreatha Offer in 5 days for result discussion.     Halina Picking MD  [facility] [place] [facility] Division of Pulmonary & Critical Care Medicine

## 2024-07-15 LAB — CULTURE, BAL-QUANTITATIVE W GRAM STAIN
Culture: 3000 — AB
Gram Stain: NONE SEEN

## 2024-07-16 ENCOUNTER — Encounter: Payer: Self-pay | Admitting: Pulmonary Disease

## 2024-07-17 LAB — CYTOLOGY - NON PAP

## 2024-07-20 NOTE — Anesthesia Preprocedure Evaluation (Signed)
 "                                  Anesthesia Evaluation  Patient identified by MRN, date of birth, ID band Patient awake    Reviewed: Allergy & Precautions, H&P , NPO status , Patient's Chart, lab work & pertinent test results, reviewed documented beta blocker date and time   Airway Mallampati: II  TM Distance: >3 FB Neck ROM: full    Dental  (+) Teeth Intact   Pulmonary COPD, Current Smoker   Pulmonary exam normal        Cardiovascular hypertension, (-) angina + Past MI  (-) Orthopnea Normal cardiovascular exam Rhythm:regular Rate:Normal     Neuro/Psych  Neuromuscular disease  negative psych ROS   GI/Hepatic negative GI ROS,,,(+) Hepatitis -  Endo/Other  negative endocrine ROSdiabetes    Renal/GU negative Renal ROS  negative genitourinary   Musculoskeletal   Abdominal   Peds  Hematology  (+) Blood dyscrasia, anemia   Anesthesia Other Findings Past Medical History: No date: COPD (chronic obstructive pulmonary disease) (HCC) No date: Diabetes mellitus without complication (HCC) No date: Hypercholesteremia No date: Hypertension No date: Myocardial infarction [facility]) Past Surgical History: 12/11/2019: AMPUTATION; Left     Comment:  Procedure: AMPUTATION RAY 5TH RAY PARTIAL;  Surgeon:               Lennie Barter, DPM;  Location: [facility];  Service:               Podiatry;  Laterality: Left; 12/11/2019: INCISION AND DRAINAGE; Left     Comment:  Procedure: INCISION AND DRAINAGE;  Surgeon: Lennie Barter, DPM;  Location: [facility];  Service: Podiatry;                Laterality: Left; 12/11/2019: LOWER EXTREMITY ANGIOGRAPHY; Left     Comment:  Procedure: Lower Extremity Angiography;  Surgeon:               Jama Cordella MATSU, MD;  Location: [facility];               Service: Cardiovascular;  Laterality: Left; No date: NO PAST SURGERIES 12/13/2019: TEE WITHOUT CARDIOVERSION; N/A     Comment:  Procedure: TRANSESOPHAGEAL ECHOCARDIOGRAM  (TEE);                Surgeon: Bosie Vinie LABOR, MD;  Location: [facility];                Service: Cardiovascular;  Laterality: N/A; 07/13/2024: VIDEO BRONCHOSCOPY; N/A     Comment:  Procedure: BRONCHOSCOPY, VIDEO-ASSISTED;  Surgeon:               Parris Manna, MD;  Location: [facility];  Service:               Thoracic;  Laterality: N/A; 07/13/2024: VIDEO BRONCHOSCOPY WITH ENDOBRONCHIAL ULTRASOUND; N/A     Comment:  Procedure: BRONCHOSCOPY, WITH EBUS;  Surgeon: Parris Manna, MD;  Location: [facility];  Service: Thoracic;                Laterality: N/A; BMI    Body Mass Index: 25.10 kg/m     Reproductive/Obstetrics negative OB ROS  Anesthesia Physical Anesthesia Plan  ASA: 3  Anesthesia Plan: General ETT   Post-op Pain Management:    Induction:   PONV Risk Score and Plan:   Airway Management Planned:   Additional Equipment:   Intra-op Plan:   Post-operative Plan:   Informed Consent: I have reviewed the patients History and Physical, chart, labs and discussed the procedure including the risks, benefits and alternatives for the proposed anesthesia with the patient or authorized representative who has indicated his/her understanding and acceptance.     Dental Advisory Given  Plan Discussed with: CRNA  Anesthesia Plan Comments:         Anesthesia Quick Evaluation  "

## 2024-07-20 NOTE — Anesthesia Postprocedure Evaluation (Signed)
"   Anesthesia Post Note  Patient: Ural Acree  Procedure(s) Performed: BRONCHOSCOPY, VIDEO-ASSISTED BRONCHOSCOPY, WITH EBUS  Patient location during evaluation: PACU Anesthesia Type: General Level of consciousness: awake and alert Pain management: pain level controlled Vital Signs Assessment: post-procedure vital signs reviewed and stable Respiratory status: spontaneous breathing, nonlabored ventilation, respiratory function stable and patient connected to nasal cannula oxygen Cardiovascular status: blood pressure returned to baseline and stable Postop Assessment: no apparent nausea or vomiting Anesthetic complications: no   There were no known notable events for this encounter.   Last Vitals:  Vitals:   07/13/24 1543 07/13/24 1551  BP: (!) 189/87 (!) 183/69  Pulse: 79   Resp: 16 18  Temp: (!) 36.3 C 36.6 C  SpO2: 100% 99%    Last Pain:  Vitals:   07/16/24 1122  TempSrc:   PainSc: 0-No pain                 Lynwood KANDICE Clause      "

## 2024-08-06 ENCOUNTER — Ambulatory Visit: Admitting: Radiation Oncology

## 2024-08-21 ENCOUNTER — Ambulatory Visit: Admitting: Radiation Oncology
